# Patient Record
Sex: Female | Born: 1980 | Race: White | Hispanic: No | Marital: Single | Smoking: Former smoker
Health system: Southern US, Community
[De-identification: ages and names within clinical notes are randomized; demographics above are authoritative.]

## PROBLEM LIST (undated history)

## (undated) ENCOUNTER — Inpatient Hospital Stay (HOSPITAL_COMMUNITY): Payer: Self-pay

## (undated) DIAGNOSIS — B999 Unspecified infectious disease: Secondary | ICD-10-CM

## (undated) DIAGNOSIS — R112 Nausea with vomiting, unspecified: Secondary | ICD-10-CM

## (undated) DIAGNOSIS — T8859XA Other complications of anesthesia, initial encounter: Secondary | ICD-10-CM

## (undated) DIAGNOSIS — N879 Dysplasia of cervix uteri, unspecified: Secondary | ICD-10-CM

## (undated) DIAGNOSIS — N83209 Unspecified ovarian cyst, unspecified side: Secondary | ICD-10-CM

## (undated) DIAGNOSIS — J45909 Unspecified asthma, uncomplicated: Secondary | ICD-10-CM

## (undated) DIAGNOSIS — F419 Anxiety disorder, unspecified: Secondary | ICD-10-CM

## (undated) DIAGNOSIS — B977 Papillomavirus as the cause of diseases classified elsewhere: Secondary | ICD-10-CM

## (undated) DIAGNOSIS — Z8619 Personal history of other infectious and parasitic diseases: Secondary | ICD-10-CM

## (undated) DIAGNOSIS — N159 Renal tubulo-interstitial disease, unspecified: Secondary | ICD-10-CM

## (undated) DIAGNOSIS — N3090 Cystitis, unspecified without hematuria: Secondary | ICD-10-CM

## (undated) DIAGNOSIS — Z9889 Other specified postprocedural states: Secondary | ICD-10-CM

## (undated) DIAGNOSIS — R0602 Shortness of breath: Secondary | ICD-10-CM

## (undated) DIAGNOSIS — D649 Anemia, unspecified: Secondary | ICD-10-CM

## (undated) DIAGNOSIS — IMO0002 Reserved for concepts with insufficient information to code with codable children: Secondary | ICD-10-CM

## (undated) DIAGNOSIS — R87619 Unspecified abnormal cytological findings in specimens from cervix uteri: Secondary | ICD-10-CM

## (undated) DIAGNOSIS — T4145XA Adverse effect of unspecified anesthetic, initial encounter: Secondary | ICD-10-CM

## (undated) HISTORY — DX: Unspecified ovarian cyst, unspecified side: N83.209

## (undated) HISTORY — DX: Unspecified abnormal cytological findings in specimens from cervix uteri: R87.619

## (undated) HISTORY — DX: Anxiety disorder, unspecified: F41.9

## (undated) HISTORY — DX: Other complications of anesthesia, initial encounter: T88.59XA

## (undated) HISTORY — DX: Papillomavirus as the cause of diseases classified elsewhere: B97.7

## (undated) HISTORY — DX: Reserved for concepts with insufficient information to code with codable children: IMO0002

## (undated) HISTORY — DX: Unspecified infectious disease: B99.9

## (undated) HISTORY — DX: Personal history of other infectious and parasitic diseases: Z86.19

## (undated) HISTORY — DX: Shortness of breath: R06.02

## (undated) HISTORY — DX: Other specified postprocedural states: Z98.890

## (undated) HISTORY — PX: WISDOM TOOTH EXTRACTION: SHX21

## (undated) HISTORY — DX: Other specified postprocedural states: R11.2

## (undated) HISTORY — PX: ABDOMINAL SURGERY: SHX537

## (undated) HISTORY — DX: Unspecified asthma, uncomplicated: J45.909

## (undated) HISTORY — DX: Renal tubulo-interstitial disease, unspecified: N15.9

## (undated) HISTORY — DX: Anemia, unspecified: D64.9

## (undated) HISTORY — DX: Dysplasia of cervix uteri, unspecified: N87.9

## (undated) HISTORY — DX: Adverse effect of unspecified anesthetic, initial encounter: T41.45XA

## (undated) HISTORY — DX: Cystitis, unspecified without hematuria: N30.90

---
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
## 2002-01-25 ENCOUNTER — Other Ambulatory Visit: Admission: RE | Admit: 2002-01-25 | Discharge: 2002-01-25 | Payer: Self-pay | Admitting: Obstetrics and Gynecology

## 2002-12-19 ENCOUNTER — Other Ambulatory Visit: Admission: RE | Admit: 2002-12-19 | Discharge: 2002-12-19 | Payer: Self-pay | Admitting: Obstetrics and Gynecology

## 2003-12-22 ENCOUNTER — Other Ambulatory Visit: Admission: RE | Admit: 2003-12-22 | Discharge: 2003-12-22 | Payer: Self-pay | Admitting: Obstetrics and Gynecology

## 2005-08-03 ENCOUNTER — Other Ambulatory Visit: Admission: RE | Admit: 2005-08-03 | Discharge: 2005-08-03 | Payer: Self-pay | Admitting: Obstetrics and Gynecology

## 2007-05-17 DIAGNOSIS — IMO0002 Reserved for concepts with insufficient information to code with codable children: Secondary | ICD-10-CM

## 2007-05-17 HISTORY — DX: Reserved for concepts with insufficient information to code with codable children: IMO0002

## 2007-07-11 ENCOUNTER — Encounter (INDEPENDENT_AMBULATORY_CARE_PROVIDER_SITE_OTHER): Payer: Self-pay | Admitting: Obstetrics and Gynecology

## 2007-07-11 ENCOUNTER — Ambulatory Visit (HOSPITAL_COMMUNITY): Admission: RE | Admit: 2007-07-11 | Discharge: 2007-07-11 | Payer: Self-pay | Admitting: Obstetrics and Gynecology

## 2010-09-28 NOTE — Op Note (Signed)
NAME:  THEOLA, CUELLAR             ACCOUNT NO.:  [id number]   MEDICAL RECORD NO.:  [id number]          PATIENT TYPE:  AMB   LOCATION:  SDC                           FACILITY:  [facility]   PHYSICIAN:  Naima A. Dillard, M.D. DATE OF BIRTH:  Mar 03, 1981   DATE OF PROCEDURE:  07/11/2007  DATE OF DISCHARGE:                               OPERATIVE REPORT   TIME OF SURGERY:  9:22 a.m.   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   PROCEDURE:  LEEP (loop electrosurgical excision procedure), ECC  (endocervical curettage) and colposcopy.   SURGEON:  Naima A. Dillard, M.D.   ASSISTANT:  No assistants.   ANESTHESIA:  MAC and local.   FINDINGS:  Are acetowhite changes and mosaicism at 12 to 2 o'clock and  acetowhite changes at 6 o'clock on colposcopy.   SPECIMENS:  Are LEEP specimen of the cervix sent to pathology.   ESTIMATED BLOOD LOSS:  Minimal.   COMPLICATIONS:  None.   DISPOSITION:  The patient went to PACU in stable condition.   PROCEDURE IN DETAIL:  The patient was taken to the operating room and  placed in dorsal lithotomy position and prepped and draped in a normal  sterile fashion.  A bivalve speculum was placed into the vagina and  acetic acid was placed along the cervix.  Colposcopy was done.  The  findings noted above were seen.  The patient was then prepped and  draped.  A coated bivalve speculum was placed into the vagina.  Anterior  lip of the cervix was grasped with a single tooth tenaculum.  Twenty mL  of 2% lidocaine was used for cervical block.  Lugol's solution was then  placed and noted to have defects all along the transition zone but  mainly from 12 to 2.  The large white loop was then used to remove the  transition zone without difficulty.  It was placed on a cork board for  pathology.  Before the  LEEP was done I did do ECC (endocervical curettage) of the endocervical  canal.  Any  bleeding areas were made hemostatic with Bovie cautery and  Monsel's solution.  All instruments were removed from the vagina.  Sponge, lap and needle counts were correct.  The patient went to the  recovery room in stable condition.      Naima A. Normand Sloop, M.D.  Electronically Signed     NAD/MEDQ  D:  07/11/2007  T:  07/11/2007  Job:  [id number]

## 2010-09-28 NOTE — H&P (Signed)
NAME:  Tonya Day, Tonya Day             ACCOUNT NO.:  [id number]   MEDICAL RECORD NO.:  [id number]          PATIENT TYPE:  AMB   LOCATION:  SDC                           [facility]:  [facility]   PHYSICIAN:  Naima A. Dillard, M.D. DATE OF BIRTH:  08-Jun-1980   DATE OF ADMISSION:  DATE OF DISCHARGE:                              HISTORY & PHYSICAL   ADMISSION DIAGNOSES:  1. Cervical intraepithelial neoplasia-II.  2. Cervical intraepithelial neoplasia-III.   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  The patient declined  having a colposcopy at that time.  She then came in 2009 for another Pap  smear which showed ASCUS, cannot rule out a high-grade lesion.  She  decided to have colposcopy, a biopsy was done at 2 o'clock and was  significant for CIN-II to III. The patient was given the option of  observation versus cryo and LEEP.  All treatments were reviewed with  risks and benefits.  The patient has chosen LEEP.   PAST MEDICAL HISTORY:  Anxiety disorder.   PAST SURGICAL HISTORY:  Unremarkable.   OB HISTORY:  Significant for spontaneous abortion in the first trimester  without any complications.   SOCIAL HISTORY:  The patient denies any alcohol or illicit drug use.   FAMILY HISTORY:  Significant for diabetes and hypertension.   MEDICATIONS:  Loestrin-24.   ALLERGIES:  The patient has no known drug allergies.   PAST SURGICAL HISTORY:  In 1997 she had a diagnostic laparoscopy.   PHYSICAL EXAMINATION:  VITAL SIGNS:  Blood pressure 126/82, 138 pounds.  Pulse is 72.  HEENT:  Pupils are equal.  Hearing is normal.  Throat clear.  NECK:  Thyroid not enlarged.  HEART:  Regular rate and rhythm.  LUNGS:  Clear to auscultation bilaterally.  ABDOMEN:  Soft and nontender without any masses or organomegaly.  EXTREMITIES:  No clubbing, cyanosis or edema.Marland Kitchen  NEUROLOGIC: Within normal limits.  VAGINAL:  Within normal limits.   Cervix nontender without any lesions.  Uterus was normal in shape, size and consistency.  Adnexa nontender.   REVIEW OF SYSTEMS:  PSYCHIATRIC:  Significant for anxiety.  GENITOURINARY:  Significant for CIN-II TO III.  CARDIOVASCULAR:  No  palpitations.  RESPIRATORY:  No history of asthma.  ENDOCRINE:  No  history of thyroid disease.   ASSESSMENT:  Cervical dysplasia.   PLAN:  LEEP.  The patient understands the risks to include bleeding,  infection, damage to the cervix causing incompetent cervix or stenotic  cervix.  The patient understands this may recur and all the margins may  not be free.      Naima A. Normand Sloop, M.D.  Electronically Signed     NAD/MEDQ  D:  07/10/2007  T:  07/11/2007  Job:  [id number]

## 2010-09-28 NOTE — Op Note (Signed)
NAME:  RENELL, COAXUM             ACCOUNT NO.:  [id number]   MEDICAL RECORD NO.:  [id number]          PATIENT TYPE:  AMB   LOCATION:  SDC                           [facility]:  [facility]   PHYSICIAN:  Naima A. Dillard, M.D. DATE OF BIRTH:  [birth year]   DATE OF PROCEDURE:  07/11/2007  DATE OF DISCHARGE:                               OPERATIVE REPORT   PREOPERATIVE DIAGNOSIS:  Cervical dysplasia.   POSTOPERATIVE DIAGNOSIS:  Cervical dysplasia.   PROCEDURE:  Loop electrode excision procedure, endocervical curettage,  colposcopy.   SURGEON:  Naima A. Normand Sloop, M.D.   Dictation ends at this point.      Naima A. Normand Sloop, M.D.  Electronically Signed     NAD/MEDQ  D:  07/11/2007  T:  07/11/2007  Job:  [id number]

## 2011-02-07 LAB — CBC
Platelets: 246
RDW: 12.4
WBC: 7.1

## 2011-02-07 LAB — HCG, QUANTITATIVE, PREGNANCY: hCG, Beta Chain, Quant, S: <2

## 2011-07-12 ENCOUNTER — Ambulatory Visit: Payer: Self-pay | Admitting: Obstetrics and Gynecology

## 2011-08-03 ENCOUNTER — Ambulatory Visit: Payer: Self-pay | Admitting: Obstetrics and Gynecology

## 2011-08-30 ENCOUNTER — Ambulatory Visit: Payer: Self-pay | Admitting: Obstetrics and Gynecology

## 2011-10-19 ENCOUNTER — Ambulatory Visit: Payer: Self-pay | Admitting: Obstetrics and Gynecology

## 2011-12-16 ENCOUNTER — Encounter: Payer: Self-pay | Admitting: Obstetrics and Gynecology

## 2011-12-16 ENCOUNTER — Ambulatory Visit (INDEPENDENT_AMBULATORY_CARE_PROVIDER_SITE_OTHER): Payer: PRIVATE HEALTH INSURANCE | Admitting: Obstetrics and Gynecology

## 2011-12-16 VITALS — BP 102/64 | HR 78 | Ht 63.0 in | Wt 157.0 lb

## 2011-12-16 DIAGNOSIS — Z124 Encounter for screening for malignant neoplasm of cervix: Secondary | ICD-10-CM

## 2011-12-16 DIAGNOSIS — D069 Carcinoma in situ of cervix, unspecified: Secondary | ICD-10-CM

## 2011-12-16 MED ORDER — DROSPIRENONE-ETHINYL ESTRADIOL 3-0.02 MG PO TABS: 1.0000 | ORAL_TABLET | Freq: Every day | ORAL | Status: DC

## 2011-12-16 NOTE — Progress Notes (Signed)
Last Pap: 05/27/10 WNL: Yes Regular Periods:no, takes bc pills continuously but pt makes sure she has 3 per year Contraception: pill-gianvi  Monthly Breast exam:no Tetanus<56yrs:yes Nl.Bladder Function:yes Daily BMs:yes Healthy Diet:yes Calcium:yes Mammogram:no Date of Mammogram: n/a Exercise:yes Have often Exercise: 3-4 times per week Seatbelt: yes Abuse at home: no Stressful work:yes Sigmoid-colonoscopy: n/a Bone Density: No PCP: Dr Normand Sloop  Change in PMH: none Change in GNF:AOZH Pt without complaints Physical Examination: General appearance - alert, well appearing, and in no distress Mental status - normal mood, behavior, speech, dress, motor activity, and thought processes Neck - supple, no significant adenopathy, thyroid exam: thyroid is normal in size without nodules or tenderness Chest - clear to auscultation, no wheezes, rales or rhonchi, symmetric air entry Heart - normal rate and regular rhythm Abdomen - soft, nontender, nondistended, no masses or organomegaly Breasts - breasts appear normal, no suspicious masses, no skin or nipple changes or axillary nodes Pelvic - normal external genitalia, vulva, vagina, cervix, uterus and adnexa Rectal - normal rectal, no masses, rectal exam not indicated Back exam - full range of motion, no tenderness, palpable spasm or pain on motion Neurological - alert, oriented, normal speech, no focal findings or movement disorder noted Musculoskeletal - no joint tenderness, deformity or swelling Extremities - no edema, redness or tenderness in the calves or thighs Skin - normal coloration and turgor, no rashes, no suspicious skin lesions noted Routine exam Pap sent yes h/o CIN3 Mammogram due no pt considering pregnancy.  she will stay on YAZ for now.  RX sent.   RT 1 yr or prn preconceptional counseling done and PNV samples given to pt should she think about starting this year

## 2011-12-19 LAB — PAP IG W/ RFLX HPV ASCU

## 2012-05-16 NOTE — L&D Delivery Note (Signed)
Delivery Note  First Stage: Labor onset: [phone] with AROM Augmentation - none Analgesia /Anesthesia intrapartum: nubain 10 sub Q intrapartum AROM at 0857  Second Stage: Complete dilation at 1504 Onset of pushing at 1515 FHR second stage intermittently 115-125  Delivery of a viable female at 36 by CNM in direct OA position no nuchal cord Cord double clamped after cessation of pulsation, cut by sister  Patient assisted out of tub to bed Cord blood sample collected    Third Stage: Placenta retained for 83 minutes      1700 baby to breast      1715 pitocin IM /exam - placenta remain intact  / uterine massage without response / no movement with traction to cord      1730 - TC to Dr Wonda Olds - notified retained placenta at an hour - no separation - no bleeding      1735 - pitocin 10 units umbilical vein / up to bathroom to attempt to void - pass placenta       1750 - back to bed  / IV start  / Dr Wonda Olds in with patient      1757 - placenta manually removed by Dr Wonda Olds      1758 - cytotec 800 mcg PR      1800 - nubain 10 IV per patient request for pain       Placenta intact - 3 vc Placenta disposition: hospital disposal Uterine tone firm / bleeding small  1st degree laceration with vaginal extension Anesthesia for repair: 1% xylocaine local Repair  3-0 vicryl for vaginal repair  4-0 vicryl for 1st degree subcuticular repair Marked vaginal edema and bruising Est. Blood Loss (mL): 350  Complications: retained placenta with manual removal - no hemorrhage  Mom to postpartum.  Baby to Mom for bonding.    Marlinda Mike CNM, MSN, [facility] 01/29/2013, 6:44 PM

## 2012-05-28 ENCOUNTER — Other Ambulatory Visit (INDEPENDENT_AMBULATORY_CARE_PROVIDER_SITE_OTHER): Payer: PRIVATE HEALTH INSURANCE

## 2012-05-28 DIAGNOSIS — N912 Amenorrhea, unspecified: Secondary | ICD-10-CM

## 2012-05-28 NOTE — Progress Notes (Unsigned)
Pt here for UPT-positive. LMP 04-22-2012,  EDC-01-20-2013, PNV samples given.  Pt to sch  NOB work-up and interview.  This is pts first pregnancy.

## 2012-06-29 ENCOUNTER — Ambulatory Visit: Payer: PRIVATE HEALTH INSURANCE | Admitting: Obstetrics and Gynecology

## 2012-06-29 DIAGNOSIS — Z331 Pregnant state, incidental: Secondary | ICD-10-CM

## 2012-06-29 LAB — POCT URINALYSIS DIPSTICK
Blood, UA: NEGATIVE
Glucose, UA: NEGATIVE
Protein, UA: NEGATIVE
Spec Grav, UA: <=1.005
Urobilinogen, UA: NEGATIVE

## 2012-06-29 NOTE — Progress Notes (Signed)
NOB INTERVIEW. Pt prefers to wait until NOB W/U for PN labs due to oinsurance coverage. Pt needs toxo screen with prenatal labs. Pt declines 1st trimester screen. NOB W/u sched with VL 08/02/12

## 2012-06-30 ENCOUNTER — Encounter: Payer: Self-pay | Admitting: Obstetrics and Gynecology

## 2012-06-30 DIAGNOSIS — Z91040 Latex allergy status: Secondary | ICD-10-CM | POA: Insufficient documentation

## 2012-06-30 DIAGNOSIS — Z86718 Personal history of other venous thrombosis and embolism: Secondary | ICD-10-CM | POA: Insufficient documentation

## 2012-06-30 DIAGNOSIS — F411 Generalized anxiety disorder: Secondary | ICD-10-CM | POA: Insufficient documentation

## 2012-06-30 DIAGNOSIS — N12 Tubulo-interstitial nephritis, not specified as acute or chronic: Secondary | ICD-10-CM | POA: Insufficient documentation

## 2012-07-01 LAB — CULTURE, OB URINE

## 2012-07-03 ENCOUNTER — Telehealth: Payer: Self-pay | Admitting: Obstetrics and Gynecology

## 2012-07-06 ENCOUNTER — Ambulatory Visit: Payer: PRIVATE HEALTH INSURANCE | Admitting: Certified Nurse Midwife

## 2012-07-06 ENCOUNTER — Encounter: Payer: Self-pay | Admitting: Certified Nurse Midwife

## 2012-07-06 VITALS — BP 108/60 | Wt 166.0 lb

## 2012-07-06 LAB — POCT WET PREP (WET MOUNT)
Bacteria Wet Prep HPF POC: NEGATIVE
Source Wet Prep POC: NEGATIVE

## 2012-07-06 LAB — OB RESULTS CONSOLE GC/CHLAMYDIA
Chlamydia: NEGATIVE
Gonorrhea: NEGATIVE

## 2012-07-06 NOTE — Progress Notes (Signed)
[redacted]w[redacted]d NOB work-up Pap done 12/2011 - WNL GC/CT & wet prep today

## 2012-07-06 NOTE — Progress Notes (Signed)
Pt. Here for New OB workup.  Informed at onset of appt. That she was changing practices to [facility]" due to desire to have [place] for viability. Pt. Consents to visit today but declines labs. Discussed rationale for PN labs and Toxo. Also states she is awaiting private insurance.  Pt. Denies problems with pg. LMP 04-22-13 and nl.  Cyles are"like clock work".  Denies first trimester spotting.  Oriented to [facility] and CNM service (Does desire CNM care with minimal intervention).  O- Heart RRR without murmur  Lungs C to A  Thyroid wnl  Breast exam- nl  Back- No CVAT  Abd.- Non tender   FHT's audible (148-152) regular  Pelvic exam- Clear white d/c   Cx. Pink without lesions   Uterus 10-12 week size and non-tender   Adnexa nl. Ass: Pregnancy at 10 weeks and 5 days  Plan: Discussed toxoplasmosis and pg. Recommend test. (Works with [place] [employer] 10 years)  Encourage client not to change liter and encouraged to wear gloves  Discussed nl changes in pg. For first trimester as well as diet  Discussed that we recommend an anatomy scan at 18-20 weeks  Declines PNV  Offer pt. An appt. For follow up but has appt. At [facility]             Pt. States she has an appt. In 3 weeks with "[facility]"  Labs today- Wet prep- Nl, U/S-Nl, Pap, G/C and chlamydia with consent. Hulen Luster, CNM, FNP

## 2012-07-08 LAB — GC/CHLAMYDIA PROBE AMP
CT Probe RNA: NEGATIVE
GC Probe RNA: NEGATIVE

## 2012-07-13 ENCOUNTER — Encounter: Payer: PRIVATE HEALTH INSURANCE | Admitting: Certified Nurse Midwife

## 2012-07-13 ENCOUNTER — Telehealth: Payer: Self-pay | Admitting: Obstetrics and Gynecology

## 2012-07-13 ENCOUNTER — Encounter (HOSPITAL_COMMUNITY): Payer: Self-pay | Admitting: Obstetrics and Gynecology

## 2012-07-13 ENCOUNTER — Other Ambulatory Visit: Payer: Self-pay | Admitting: Obstetrics and Gynecology

## 2012-07-13 ENCOUNTER — Inpatient Hospital Stay (HOSPITAL_COMMUNITY)
Admission: AD | Admit: 2012-07-13 | Discharge: 2012-07-13 | Disposition: A | Discharge status: Home / Self Care | Payer: PRIVATE HEALTH INSURANCE | Source: Ambulatory Visit | Attending: Obstetrics and Gynecology | Admitting: Obstetrics and Gynecology

## 2012-07-13 ENCOUNTER — Inpatient Hospital Stay (HOSPITAL_COMMUNITY): Payer: PRIVATE HEALTH INSURANCE

## 2012-07-13 DIAGNOSIS — Z2989 Encounter for other specified prophylactic measures: Secondary | ICD-10-CM | POA: Insufficient documentation

## 2012-07-13 DIAGNOSIS — O209 Hemorrhage in early pregnancy, unspecified: Secondary | ICD-10-CM | POA: Insufficient documentation

## 2012-07-13 DIAGNOSIS — N12 Tubulo-interstitial nephritis, not specified as acute or chronic: Secondary | ICD-10-CM

## 2012-07-13 LAB — CBC
HCT: 36.2 % (ref 36.0–46.0)
MCH: 29.5 pg (ref 26.0–34.0)
MCHC: 34.3 g/dL (ref 30.0–36.0)
MCV: 86 fL (ref 78.0–100.0)
RDW: 12.4 % (ref 11.5–15.5)
WBC: 10.4 10*3/uL (ref 4.0–10.5)

## 2012-07-13 MED ORDER — RHO D IMMUNE GLOBULIN 1500 UNIT/2ML IJ SOLN
300.0000 ug | Freq: Once | INTRAMUSCULAR | Status: AC
  Administered 2012-07-13: 300 ug via INTRAMUSCULAR
  Filled 2012-07-13: qty 2

## 2012-07-13 NOTE — MAU Note (Signed)
Been very light spotting since yesterday.  Is pregnant,  States is A neg, hx of received rhophylac injection when had TAB.   Quite a bit of pain on left side, not unbearable- just only on left.

## 2012-07-13 NOTE — MAU Note (Signed)
Tonya Day CNM wants to see pt, not just for injection

## 2012-07-13 NOTE — Telephone Encounter (Signed)
Tc from pt. Pt with vag spotting since yesterday. Pt c/o cramping only on left side. Pt is A -. Consulted with SL, pt to go to MAU for eval. Pt agrees.

## 2012-07-13 NOTE — MAU Note (Signed)
Noted some light bleeding yesterday, today a little more. Cramping on L side "regularly".
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
## 2012-07-14 LAB — RH IG WORKUP (INCLUDES ABO/RH)
ABO/RH(D): A NEG
Antibody Screen: NEGATIVE
Gestational Age(Wks): 11

## 2012-07-17 ENCOUNTER — Ambulatory Visit: Payer: PRIVATE HEALTH INSURANCE | Admitting: Obstetrics and Gynecology

## 2012-07-17 ENCOUNTER — Telehealth: Payer: Self-pay | Admitting: Obstetrics and Gynecology

## 2012-07-17 VITALS — BP 100/64 | Wt 165.0 lb

## 2012-07-17 NOTE — Progress Notes (Signed)
[redacted]w[redacted]d Work in visit C/o continued spotting, had [place] in MAU on Friday that was normal Pt states bleeding has not been red, changes a panty liner a couple times/day Has some lower pelvic cramping, RLP Discussed stretching and exercises Pt declines to have f/u visits, is tx  Instructed pt on pelvic rest RTO 1 wk if bleeding doesn't stop, will repeat US Or if pain or heavy bleeding

## 2012-07-17 NOTE — Telephone Encounter (Signed)
Tc to pt c/o continuous spotting and cramping. Pt states spotting is light brown in color and was seen at MAU 4 days ago with the same problem. Pt was given rhogam, offered appt this afternoon with SL for evaluation. Pt agreeable.

## 2012-07-17 NOTE — Progress Notes (Signed)
[redacted]w[redacted]d C/o brown vaginal discharge, aching, pulling, and cramping in abdominal area  Seen in MAU on Friday for same sx's Pt declines pelvic exam today. Pt is transferring to [facility]

## 2012-11-10 ENCOUNTER — Encounter (HOSPITAL_COMMUNITY): Payer: Self-pay | Admitting: *Deleted

## 2012-11-10 ENCOUNTER — Inpatient Hospital Stay (HOSPITAL_COMMUNITY)
Admission: AD | Admit: 2012-11-10 | Discharge: 2012-11-10 | Disposition: A | Discharge status: Home / Self Care | Payer: 59 | Source: Ambulatory Visit | Attending: Obstetrics & Gynecology | Admitting: Obstetrics & Gynecology

## 2012-11-10 DIAGNOSIS — N12 Tubulo-interstitial nephritis, not specified as acute or chronic: Secondary | ICD-10-CM

## 2012-11-10 DIAGNOSIS — A088 Other specified intestinal infections: Secondary | ICD-10-CM | POA: Insufficient documentation

## 2012-11-10 DIAGNOSIS — O99891 Other specified diseases and conditions complicating pregnancy: Secondary | ICD-10-CM | POA: Insufficient documentation

## 2012-11-10 LAB — URINALYSIS, ROUTINE W REFLEX MICROSCOPIC
Bilirubin Urine: NEGATIVE
Glucose, UA: NEGATIVE mg/dL
Glucose, UA: NEGATIVE mg/dL
Hgb urine dipstick: NEGATIVE
Ketones, ur: >80 mg/dL — AB
Leukocytes, UA: NEGATIVE
Protein, ur: NEGATIVE mg/dL
pH: 6 (ref 5.0–8.0)

## 2012-11-10 MED ORDER — PROMETHAZINE HCL 25 MG/ML IJ SOLN
25.0000 mg | Freq: Once | INTRAMUSCULAR | Status: AC
  Administered 2012-11-10: 25 mg via INTRAVENOUS
  Filled 2012-11-10: qty 1

## 2012-11-10 MED ORDER — LACTATED RINGERS IV BOLUS (SEPSIS)
1000.0000 mL | Freq: Once | INTRAVENOUS | Status: AC
  Administered 2012-11-10: 1000 mL via INTRAVENOUS

## 2012-11-10 MED ORDER — ONDANSETRON HCL 4 MG PO TABS: 4.0000 mg | ORAL_TABLET | Freq: Four times a day (QID) | ORAL | Status: DC

## 2012-11-10 NOTE — MAU Note (Signed)
Pt reports having N/V/D with chills since yesterday. Can not even keep water down.
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
## 2012-11-13 LAB — OB RESULTS CONSOLE RUBELLA ANTIBODY, IGM: Rubella: UNDETERMINED

## 2012-11-13 LAB — OB RESULTS CONSOLE HIV ANTIBODY (ROUTINE TESTING): HIV: NONREACTIVE

## 2013-01-01 LAB — OB RESULTS CONSOLE GBS: GBS: NEGATIVE

## 2013-01-27 ENCOUNTER — Inpatient Hospital Stay (HOSPITAL_COMMUNITY): Admission: AD | Admit: 2013-01-27 | Payer: Self-pay | Source: Ambulatory Visit | Admitting: Obstetrics & Gynecology

## 2013-01-28 ENCOUNTER — Telehealth (HOSPITAL_COMMUNITY): Payer: Self-pay | Admitting: *Deleted

## 2013-01-28 ENCOUNTER — Encounter (HOSPITAL_COMMUNITY): Payer: Self-pay | Admitting: *Deleted

## 2013-01-28 NOTE — Telephone Encounter (Signed)
Preadmission screen  

## 2013-01-29 ENCOUNTER — Encounter (HOSPITAL_COMMUNITY): Payer: Self-pay | Admitting: Anesthesiology

## 2013-01-29 ENCOUNTER — Encounter (HOSPITAL_COMMUNITY): Payer: Self-pay

## 2013-01-29 ENCOUNTER — Inpatient Hospital Stay (HOSPITAL_COMMUNITY)
Admission: RE | Admit: 2013-01-29 | Discharge: 2013-01-31 | DRG: 774 | Disposition: A | Discharge status: Home / Self Care | Payer: 59 | Source: Ambulatory Visit | Attending: Obstetrics & Gynecology | Admitting: Obstetrics & Gynecology

## 2013-01-29 VITALS — BP 125/80 | HR 78 | Temp 97.9°F | Resp 16 | Ht 63.0 in | Wt 207.0 lb

## 2013-01-29 DIAGNOSIS — O9903 Anemia complicating the puerperium: Secondary | ICD-10-CM | POA: Diagnosis not present

## 2013-01-29 DIAGNOSIS — D62 Acute posthemorrhagic anemia: Secondary | ICD-10-CM | POA: Diagnosis not present

## 2013-01-29 DIAGNOSIS — O139 Gestational [pregnancy-induced] hypertension without significant proteinuria, unspecified trimester: Principal | ICD-10-CM | POA: Diagnosis present

## 2013-01-29 DIAGNOSIS — O480 Post-term pregnancy: Secondary | ICD-10-CM | POA: Diagnosis present

## 2013-01-29 DIAGNOSIS — N12 Tubulo-interstitial nephritis, not specified as acute or chronic: Secondary | ICD-10-CM

## 2013-01-29 LAB — URIC ACID: Uric Acid, Serum: 6.5 mg/dL (ref 2.4–7.0)

## 2013-01-29 LAB — RPR: RPR Ser Ql: NONREACTIVE

## 2013-01-29 LAB — COMPREHENSIVE METABOLIC PANEL
ALT: 14 U/L (ref 0–35)
AST: 23 U/L (ref 0–37)
Albumin: 2.4 g/dL — ABNORMAL LOW (ref 3.5–5.2)
Alkaline Phosphatase: 174 U/L — ABNORMAL HIGH (ref 39–117)
BUN: 11 mg/dL (ref 6–23)
CO2: 21 mEq/L (ref 19–32)
Calcium: 8.7 mg/dL (ref 8.4–10.5)
Chloride: 103 mEq/L (ref 96–112)
Creatinine, Ser: 0.84 mg/dL (ref 0.50–1.10)
GFR calc Af Amer: >90 mL/min (ref >90)
GFR calc non Af Amer: >90 mL/min (ref >90)
Glucose, Bld: 113 mg/dL — ABNORMAL HIGH (ref 70–99)
Potassium: 3.5 mEq/L (ref 3.5–5.1)
Sodium: 135 mEq/L (ref 135–145)
Total Bilirubin: 0.2 mg/dL — ABNORMAL LOW (ref 0.3–1.2)
Total Protein: 5.6 g/dL — ABNORMAL LOW (ref 6.0–8.3)

## 2013-01-29 LAB — TYPE AND SCREEN
ABO/RH(D): A NEG
Antibody Screen: POSITIVE
DAT, IgG: NEGATIVE

## 2013-01-29 LAB — CBC
HCT: 32 % — ABNORMAL LOW (ref 36.0–46.0)
Hemoglobin: 10.6 g/dL — ABNORMAL LOW (ref 12.0–15.0)
MCH: 29 pg (ref 26.0–34.0)
MCHC: 33.1 g/dL (ref 30.0–36.0)
MCV: 87.7 fL (ref 78.0–100.0)
Platelets: 158 10*3/uL (ref 150–400)
RBC: 3.65 MIL/uL — ABNORMAL LOW (ref 3.87–5.11)
RDW: 15.8 % — ABNORMAL HIGH (ref 11.5–15.5)
WBC: 9.1 10*3/uL (ref 4.0–10.5)

## 2013-01-29 MED ORDER — FLUTICASONE PROPIONATE HFA 44 MCG/ACT IN AERO
2.0000 | INHALATION_SPRAY | Freq: Two times a day (BID) | RESPIRATORY_TRACT | Status: DC
  Filled 2013-01-29: qty 10.6

## 2013-01-29 MED ORDER — MISOPROSTOL 200 MCG PO TABS
ORAL_TABLET | ORAL | Status: AC
  Filled 2013-01-29: qty 1

## 2013-01-29 MED ORDER — OXYTOCIN 40 UNITS IN LACTATED RINGERS INFUSION - SIMPLE MED
INTRAVENOUS | Status: AC
  Administered 2013-01-29: 40 [IU] via INTRAVENOUS
  Filled 2013-01-29: qty 1000

## 2013-01-29 MED ORDER — SODIUM CHLORIDE 0.9 % IV SOLN: 250.0000 mL | INTRAVENOUS | Status: DC | PRN

## 2013-01-29 MED ORDER — DIBUCAINE 1 % RE OINT: 1.0000 "application " | TOPICAL_OINTMENT | RECTAL | Status: DC | PRN

## 2013-01-29 MED ORDER — OXYTOCIN 10 UNIT/ML IJ SOLN
10.0000 [IU] | Freq: Once | INTRAMUSCULAR | Status: AC
  Administered 2013-01-29 (×2): 10 [IU] via INTRAMUSCULAR
  Filled 2013-01-29 (×2): qty 1

## 2013-01-29 MED ORDER — IBUPROFEN 600 MG PO TABS
600.0000 mg | ORAL_TABLET | Freq: Four times a day (QID) | ORAL | Status: DC
  Administered 2013-01-29 – 2013-01-31 (×6): 600 mg via ORAL
  Filled 2013-01-29 (×6): qty 1

## 2013-01-29 MED ORDER — NITROGLYCERIN 0.4 MG/SPRAY TL SOLN
1.0000 | Status: DC | PRN
  Filled 2013-01-29: qty 4.9

## 2013-01-29 MED ORDER — BENZOCAINE-MENTHOL 20-0.5 % EX AERO
1.0000 "application " | INHALATION_SPRAY | CUTANEOUS | Status: DC | PRN
  Administered 2013-01-29: 1 via TOPICAL
  Filled 2013-01-29: qty 56

## 2013-01-29 MED ORDER — OXYTOCIN 10 UNIT/ML IJ SOLN: 10.0000 [IU] | Freq: Once | INTRAMUSCULAR | Status: DC

## 2013-01-29 MED ORDER — HYDROCHLOROTHIAZIDE 12.5 MG PO CAPS
12.5000 mg | ORAL_CAPSULE | Freq: Every day | ORAL | Status: DC
  Administered 2013-01-30: 12.5 mg via ORAL
  Filled 2013-01-29 (×2): qty 1

## 2013-01-29 MED ORDER — SIMETHICONE 80 MG PO CHEW: 80.0000 mg | CHEWABLE_TABLET | ORAL | Status: DC | PRN

## 2013-01-29 MED ORDER — SODIUM CHLORIDE 0.9 % IJ SOLN: 3.0000 mL | Freq: Two times a day (BID) | INTRAMUSCULAR | Status: DC

## 2013-01-29 MED ORDER — MISOPROSTOL 200 MCG PO TABS
800.0000 ug | ORAL_TABLET | Freq: Once | ORAL | Status: AC
  Administered 2013-01-29: 800 ug via VAGINAL

## 2013-01-29 MED ORDER — MISOPROSTOL 200 MCG PO TABS
ORAL_TABLET | ORAL | Status: AC
  Administered 2013-01-29: 800 ug via VAGINAL
  Filled 2013-01-29: qty 3

## 2013-01-29 MED ORDER — CITRIC ACID-SODIUM CITRATE 334-500 MG/5ML PO SOLN
30.0000 mL | ORAL | Status: DC | PRN
  Filled 2013-01-29: qty 15

## 2013-01-29 MED ORDER — NALBUPHINE SYRINGE 5 MG/0.5 ML
10.0000 mg | INJECTION | INTRAMUSCULAR | Status: DC | PRN
  Administered 2013-01-29: 10 mg via SUBCUTANEOUS
  Filled 2013-01-29 (×3): qty 1

## 2013-01-29 MED ORDER — LACTATED RINGERS IV BOLUS (SEPSIS)
500.0000 mL | Freq: Once | INTRAVENOUS | Status: AC
  Administered 2013-01-29: 500 mL via INTRAVENOUS

## 2013-01-29 MED ORDER — OXYTOCIN 40 UNITS IN LACTATED RINGERS INFUSION - SIMPLE MED
500.0000 mL/h | INTRAVENOUS | Status: DC
  Administered 2013-01-29: 40 [IU] via INTRAVENOUS

## 2013-01-29 MED ORDER — SODIUM CHLORIDE 0.9 % IJ SOLN: 3.0000 mL | INTRAMUSCULAR | Status: DC | PRN

## 2013-01-29 MED ORDER — ACETAMINOPHEN 325 MG PO TABS: 650.0000 mg | ORAL_TABLET | ORAL | Status: DC | PRN

## 2013-01-29 MED ORDER — NALBUPHINE SYRINGE 5 MG/0.5 ML
10.0000 mg | INJECTION | Freq: Once | INTRAMUSCULAR | Status: AC
  Administered 2013-01-29: 10 mg via INTRAVENOUS

## 2013-01-29 MED ORDER — IBUPROFEN 600 MG PO TABS
600.0000 mg | ORAL_TABLET | Freq: Four times a day (QID) | ORAL | Status: DC | PRN
  Administered 2013-01-29: 600 mg via ORAL
  Filled 2013-01-29: qty 1

## 2013-01-29 MED ORDER — LIDOCAINE HCL (PF) 1 % IJ SOLN
30.0000 mL | INTRAMUSCULAR | Status: DC | PRN
Start: 2013-01-29 — End: 2013-01-31
  Administered 2013-01-29: 30 mL via SUBCUTANEOUS
  Filled 2013-01-29 (×2): qty 30

## 2013-01-29 MED ORDER — LANOLIN HYDROUS EX OINT: TOPICAL_OINTMENT | CUTANEOUS | Status: DC | PRN

## 2013-01-29 MED ORDER — WITCH HAZEL-GLYCERIN EX PADS: 1.0000 "application " | MEDICATED_PAD | CUTANEOUS | Status: DC | PRN

## 2013-01-29 MED ORDER — OXYCODONE-ACETAMINOPHEN 5-325 MG PO TABS
1.0000 | ORAL_TABLET | ORAL | Status: DC | PRN
  Administered 2013-01-30 (×2): 1 via ORAL
  Filled 2013-01-29 (×2): qty 1

## 2013-01-29 NOTE — Progress Notes (Signed)
S:  Ctx painful - requesting medication and water immersion  O:  VS: Blood pressure 140/84, pulse 87, temperature 98.2 F (36.8 C), temperature source Oral, resp. rate 20, height 5\' 3"  (1.6 m), weight 93.895 kg (207 lb), last menstrual period 04/22/2012, SpO2 95.00%.        FHR : 145 rate - no audible decels        Toco: contractions every 2-3 minutes / strong         Cervix : 6cm / 90% / vtx / -1        Membranes: clear fluid - small show  A: active labor     FHR category 1  P: nubain sub-q now     water immersion - spouse and SNM and CNM providing labor support and supervision     Marlinda Mike CNM, MSN, [facility] 01/29/2013, 1:14 PM

## 2013-01-29 NOTE — Progress Notes (Signed)
S:  breathing and grunting with most ctx - constant pressure       laboring in tub after nubain - good control with ctx / resting between ctx        O:  VS: Blood pressure 141/64, pulse 74, temperature 98.2 F (36.8 C), temperature source Oral, resp. rate 18, height 5\' 3"  (1.6 m), weight 93.895 kg (207 lb), last menstrual period 04/22/2012, SpO2 95.00%, unknown if currently breastfeeding.        FHR : 125-130        Toco: contractions every 6-8  minutes         Cervix : 10 cm / 100% / vtx + 2 with caput        Membranes: ? thin yellow meconium versus thick mucus discharge  A: active labor - second stage      P: anticipate SVB - waterbirth    Marlinda Mike CNM, MSN, [facility] 01/29/2013, 6:19 PM

## 2013-01-29 NOTE — Progress Notes (Signed)
S:  Not aware of ctx  O:  VS: Blood pressure 132/93, pulse 106, temperature 97.9 F (36.6 C), temperature source Oral, resp. rate 20, height 5\' 3"  (1.6 m), weight 93.895 kg (207 lb), last menstrual period 04/22/2012.        FHR : baseline 145 / variability moderate / accelerations + / no decelerations        Toco: contractions every 5-8  minutes / mild         Cervix : 4 / 80 % / vtx / -1        Membranes: AROM - clear  A: induction of labor - AROM     FHR category 1  P: remove EFM     activity ad lib     planning water immersion in labor - water birth desired   Tonya Day CNM, MSN, [facility] 01/29/2013, 9:16 AM

## 2013-01-29 NOTE — Progress Notes (Signed)
Called Marlinda Mike CNM regarding patient and BP parameters. Last BP 141/70 with routine post-partum parameters to call for BP >140 systolic or >90 systolic. New parameters received. Will start HCTZ in AM as ordered previously. Informed her of trickle/gush with fundal massage but lochia small after voiding. Will continue to monitor patient.

## 2013-01-29 NOTE — Progress Notes (Signed)
Fredric Mare, CNM, at bedside. Orders to give 500 ml pitocin bolus followed by 500 ml bolus of LR . After, RN to NSL pt prior to transfer to [facility]

## 2013-01-29 NOTE — H&P (Signed)
OB ADMISSION/ HISTORY & PHYSICAL:  Admission Date: 01/29/2013  7:35 AM  Admit Diagnosis: 40.[redacted] weeks pregnant / dependent edema / mild hypertension  Tonya Day is a 32 y.o. female presenting for labor induction with AROM.  Prenatal History: G3P0020   EDC : 01/27/2013, by Last Menstrual Period  Prenatal care at [facility]  Primary Ob Provider: Marlinda Mike CNM Prenatal course complicated by excessive maternal weight gain - dependent edema / mild hypertension / post dates / anxiety  Prenatal Labs: ABO, Rh: --/--/A NEG (02/28 1235) Antibody: Negative (07/01 0000) Rubella: Equivocal (07/01 0000)  RPR: Nonreactive (07/01 0000)  HBsAg: Negative (07/01 0000)  HIV: Non-reactive (07/01 0000)  GTT: NL GBS: Negative (08/19 0000)   Medical / Surgical History :  Past medical history:  Past Medical History  Diagnosis Date  . Ovarian cyst   . Bladder infection   . History of chicken pox   . Complication of anesthesia   . Cervical dysplasia   . Abnormal pap   . HPV (human papilloma virus) infection   . PONV (postoperative nausea and vomiting)   . Abnormal Pap smear 2009    leep; LAST PAP 2013  . Infection     UTI X 2  . Shortness of breath   . Anemia     TAKES FE  . Kidney infection 2000  . Asthma     NO PCP  . 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 Wisdom tooth extraction    . Abdominal surgery      Family History:  Family History  Problem Relation Age of Onset  . Depression Mother   . Hypertension Mother   . Hearing loss Mother   . Asthma Father   . Drug abuse Father   . Alcohol abuse Father   . Depression Sister   . Cancer Maternal Uncle     KIDNEY  . Diabetes Maternal Uncle   . Alcohol abuse Maternal Uncle   . Heart disease Maternal Uncle   . Hyperlipidemia Maternal Uncle   . Kidney disease Maternal Uncle   . Cancer Paternal Aunt    UNKNOWN; FEMALE?  Marland Kitchen Arthritis Maternal Grandmother   . Hyperlipidemia Maternal Grandmother   . Stroke Maternal Grandmother   . Heart disease Maternal Grandfather      Social History:  reports that she quit smoking about 2 years ago. Her smoking use included Cigarettes. She smoked 0.00 packs per day. She has never used smokeless tobacco. She reports that she does not drink alcohol or use illicit drugs.  Allergies: Latex; Albuterol; Other; and Red dye   Current Medications at time of admission:  Prior to Admission medications   Medication Sig Start Date End Date Taking? Authorizing Provider  budesonide (PULMICORT) 180 MCG/ACT inhaler Inhale 1 puff into the lungs 2 (two) times daily.    Historical Provider, MD  Magnesium 500 MG CAPS Take 1,000 mg by mouth at bedtime.    Historical Provider, MD  ondansetron (ZOFRAN) 4 MG tablet Take 1 tablet (4 mg total) by mouth every 6 (six) hours. 11/10/12   Genia Del, MD  OVER THE COUNTER MEDICATION Take 6 tablets by mouth daily. Patient takes Arbonne Vitamin    Historical Provider, MD    Review of Systems: Active FM Mild ctx No PIH signs except pedal edema  Physical Exam:  VS:  Blood pressure 132/93, pulse 106, temperature 97.9 F (36.6 C), temperature source Oral, resp. rate 20, height 5\' 3"  (1.6 m), weight 93.895 kg (207 lb), last menstrual period 04/22/2012.  General: alert and oriented, appears calm and comfortable Heart: RRR Lungs: Clear lung fields Abdomen: Gravid, soft and non-tender, non-distended / uterus: gravid and NT - EFW 8 pounds Extremities: 3+ pedal and ankle edema  Genitalia / VE: Dilation: 4 Effacement (%): 80 Station: -1 Exam by:: Fredric Mare, CNM  FHR: baseline rate 140 / variability moderate / accelerations + / no decelerations TOCO: mild ctx every 6-8 minutes  Assessment: 40.[redacted] weeks gestation Induction of labor planned with AROM Dependent edema Mild hypertension / pre-existing / no evidence of PEC FHR category  1   Plan:  Admit AROM Expectant management - augment if no active labor by 1pm Water immersion with desire for water birth planned check PIH labs  Dr Wonda Olds updated with admission / plan of care   Marlinda Mike CNM, MSN, [facility] 01/29/2013, 9:10 AM

## 2013-01-30 LAB — CBC
HCT: 28.4 % — ABNORMAL LOW (ref 36.0–46.0)
Hemoglobin: 9.5 g/dL — ABNORMAL LOW (ref 12.0–15.0)
MCH: 29.5 pg (ref 26.0–34.0)
MCHC: 33.5 g/dL (ref 30.0–36.0)
MCV: 88.2 fL (ref 78.0–100.0)
Platelets: 140 10*3/uL — ABNORMAL LOW (ref 150–400)
RBC: 3.22 MIL/uL — ABNORMAL LOW (ref 3.87–5.11)
RDW: 16 % — ABNORMAL HIGH (ref 11.5–15.5)
WBC: 13.3 10*3/uL — ABNORMAL HIGH (ref 4.0–10.5)

## 2013-01-30 MED ORDER — POLYSACCHARIDE IRON COMPLEX 150 MG PO CAPS
150.0000 mg | ORAL_CAPSULE | Freq: Two times a day (BID) | ORAL | Status: DC
  Administered 2013-01-30: 150 mg via ORAL
  Filled 2013-01-30 (×3): qty 1

## 2013-01-30 MED ORDER — TETANUS-DIPHTH-ACELL PERTUSSIS 5-2.5-18.5 LF-MCG/0.5 IM SUSP
0.5000 mL | Freq: Once | INTRAMUSCULAR | Status: AC
  Administered 2013-01-30: 0.5 mL via INTRAMUSCULAR
  Filled 2013-01-30: qty 0.5

## 2013-01-30 NOTE — Progress Notes (Addendum)
PPD #1- SVD  Subjective:   Reports feeling good Tolerating po/ No nausea or vomiting Bleeding is moderate Pain controlled with Motrin, Percocet prn Up ad lib / ambulatory / voiding without problems Newborn: breastfeeding  / Circumcision: planning   Objective:   VS:  VS:  Filed Vitals:   01/29/13 2114 01/29/13 2345 01/30/13 0612 01/30/13 0756  BP: 141/70 138/80 137/83 134/71  Pulse: 78 90 86 89  Temp: 98 F (36.7 C) 98 F (36.7 C) 98.3 F (36.8 C) 98.6 F (37 C)  TempSrc: Oral Oral  Oral  Resp: 20 18 17 16   Height:      Weight:      SpO2:    95%    LABS:  Recent Labs  01/29/13 0905 01/30/13 0605  WBC 9.1 13.3*  HGB 10.6* 9.5*  PLT 158 140*   Blood type: --/--/A NEG (09/16 0905) Rubella: Equivocal (07/01 0000)   I&O: Intake/Output     09/16 0701 - 09/17 0700 09/17 0701 - 09/18 0700   P.O. 1590    I.V. (mL/kg) 500 (5.3)    IV Piggyback 500    Total Intake(mL/kg) 2590 (27.6)    Blood 350    Total Output 350     Net +2240          Urine Occurrence 300 x      Physical Exam: Alert and oriented x3 Abdomen: soft, non-tender, non-distended  Fundus: firm, non-tender, U-2 Perineum: Well approximated, no significant erythema, edema, or drainage; healing well. Lochia: moderate Extremities: 3+ edema to BLE, no calf pain or tenderness    Assessment:  PPD # 1G3P1021/ S/P:spontaneous vaginal  Gestational hypertension, delivered-resolving Anemia Doing well    Plan: Continue routine post partum orders Start Niferex 150 mg po bid Anticipate D/C home in AM   Tonya Day, N MSN, CNM 01/30/2013, 10:12 AM

## 2013-01-31 MED ORDER — HYDROCHLOROTHIAZIDE 12.5 MG PO CAPS: 12.5000 mg | ORAL_CAPSULE | Freq: Every day | ORAL | Status: AC

## 2013-01-31 NOTE — Discharge Summary (Signed)
Obstetric Discharge Summary Reason for Admission: G3 P0 0 2 0 @ 40w 2d for IOL; mild gest HTN; dependent edema Prenatal Procedures: NST and ultrasound Intrapartum Procedures: spontaneous vaginal delivery, water birth Postpartum Procedures: none Complications-Operative and Postpartum: 1st degree perineal laceration and retained placenta, manually removed Hemoglobin  Date Value Range Status  01/30/2013 9.5* 12.0 - 15.0 g/dL Final     HCT  Date Value Range Status  01/30/2013 28.4* 36.0 - 46.0 % Final    Physical Exam:  General: alert, cooperative and no distress Lochia: appropriate Uterine Fundus: firm Incision: n/a DVT Evaluation: No evidence of DVT seen on physical exam. + 2 to +3 pedal, pretib edema with +2 edema to thighs and hips  Discharge Diagnoses: G3 P1 0 2 1 s/p SVD with 1st deg lac with repair.  Retained placenta with manual extraction.  Gest HTN, resolving Mild ABL anemia  Discharge Information: Date: 01/31/2013 Activity: pelvic rest Diet: routine Medications: PNV, Ibuprofen, Colace, Iron and HCTZ Condition: stable Instructions: refer to practice specific booklet Discharge to: home Follow-up Information   Follow up with Marlinda Mike, CNM. (BP check in the office next with CNM)    Specialty:  Obstetrics and Gynecology   Contact information:   [address] [place] [address] [phone]       Newborn Data: Live born female on 01/29/13 Birth Weight: 8 lb 3 oz (3714 g) APGAR: 8, 9  Home with mother.  Kaide Gage K 01/31/2013, 8:43 AM

## 2013-01-31 NOTE — Progress Notes (Signed)
Patient ID: Tonya Day, female   DOB: 08-Nov-1980, 32 y.o.   MRN: [id number] PPD # 2  Subjective: Pt reports feeling well and eager for d/c home/ Pain controlled with ibuprofen Tolerating po/ Voiding without problems/ No n/v Bleeding is light/ Newborn info:  Information for the patient's newborn:  Tonya, Day [[id number]]  female  / circ pending, to be performed by Dr Billy Coast this am/ Feeding: breast    Objective:  VS: Blood pressure 125/80, pulse 78, temperature 97.9 F (36.6 C), temperature source Oral, resp. rate 16  Recent Labs  01/29/13 0905 01/30/13 0605  WBC 9.1 13.3*  HGB 10.6* 9.5*  HCT 32.0* 28.4*  PLT 158 140*    Blood type: --/--/A NEG (09/16 0905) Rubella: Equivocal (07/01 0000)    Physical Exam:  General: A & O x 3  alert, cooperative and no distress CV: Regular rate and rhythm Resp: clear Abdomen: soft, nontender, normal bowel sounds Uterine Fundus: firm, below umbilicus, nontender Perineum: healing with good reapproximation Lochia: minimal Ext: edema +3 pedal, pretib and +2 to thighs and hips and Homans sign is negative, no sign of DVT    A/P: PPD # 2/ G3P1021/ S/P: SVD with 1st deg lac with repair Gest HTN; resolving.  Will cont HCTZ x 5 days BP check in office in 1 week Doing well and stable for discharge home RX: Ibuprofen 600mg  po Q 6 hrs prn pain #30 Refill x 1 Niferex 150mg  po QD/BID #30/#60 Refill x 1 Colace 100mg  po BID prn constipation HCTZ 12.5mg  po QD x 5 days WOB/GYN booklet given Routine pp visit in 6wks   Demetrius Revel, MSN, [facility] 01/31/2013, 8:41 AM

## 2013-01-31 NOTE — Progress Notes (Signed)
Pt discharged before CSW could assess history of anxiety. 

## 2014-03-03 IMAGING — US US OB COMP LESS 14 WK
1 series · 13 of 28 positions shown · non-contrast
Comparison: none

[Series 1: us ob comp less 14 wks · 13 of 40 slices shown]
[im 2/40]
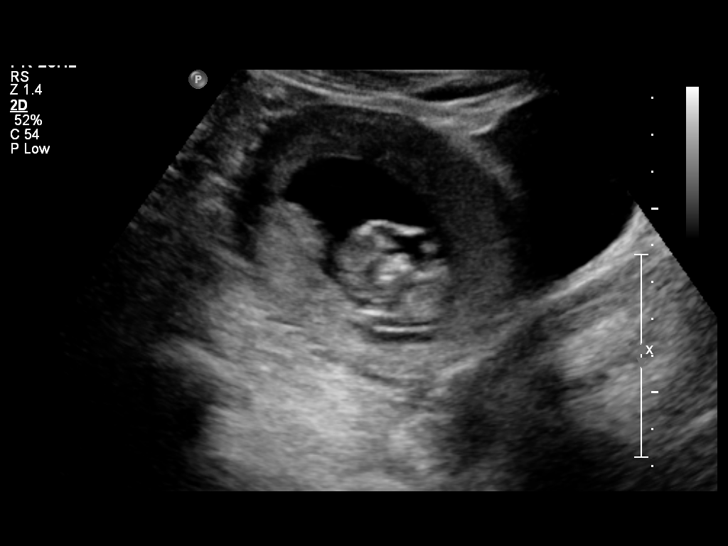
[im 5/40]
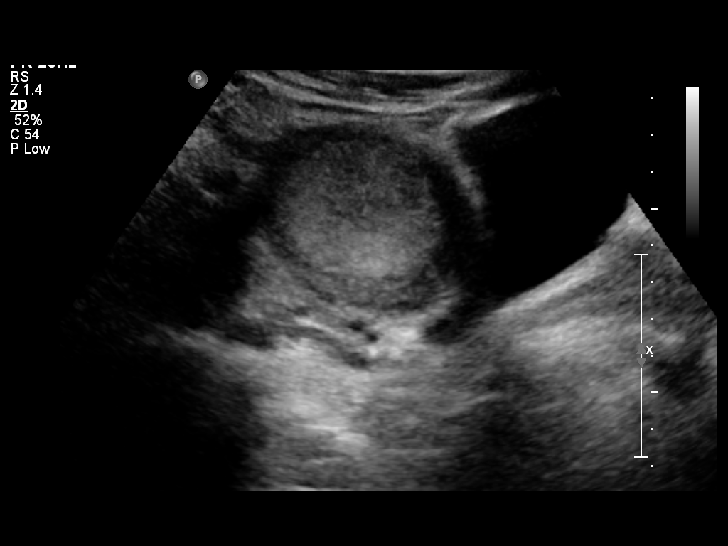
[im 8/40]
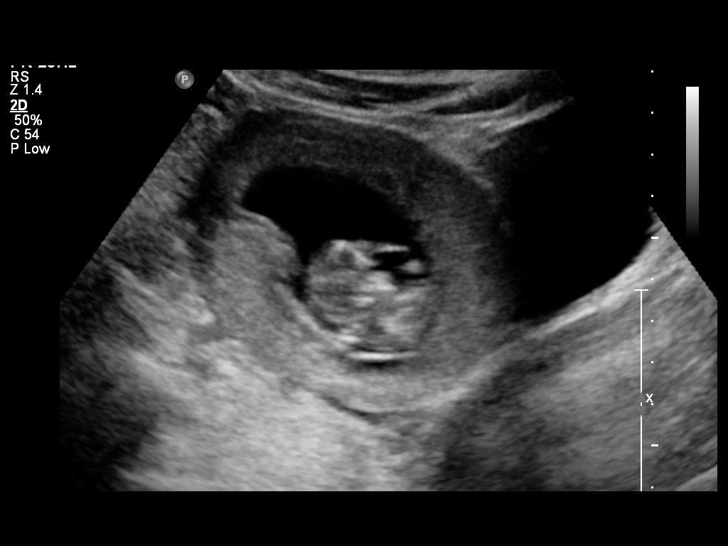
[im 11/40]
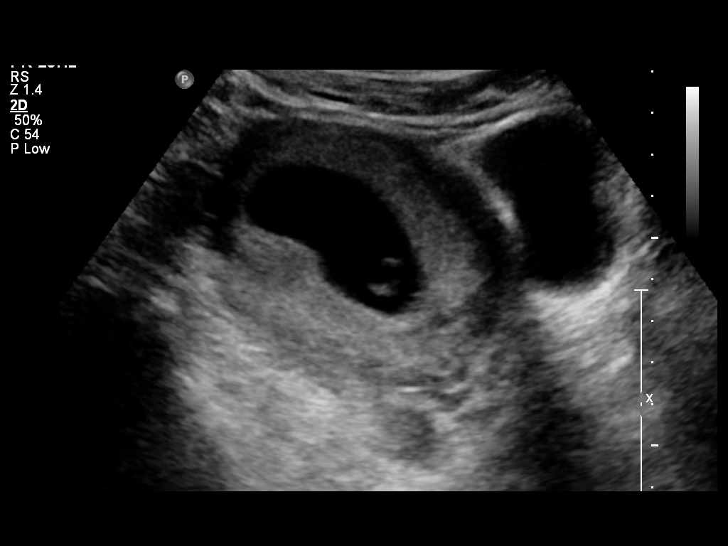
[im 14/40]
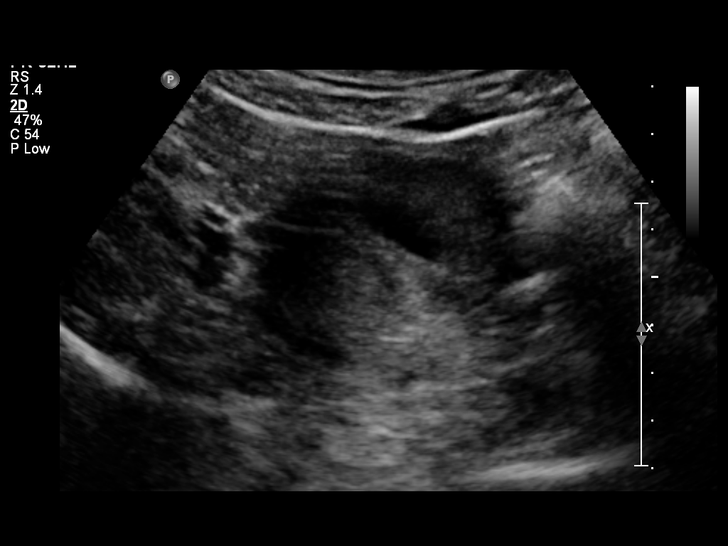
[im 16/40]
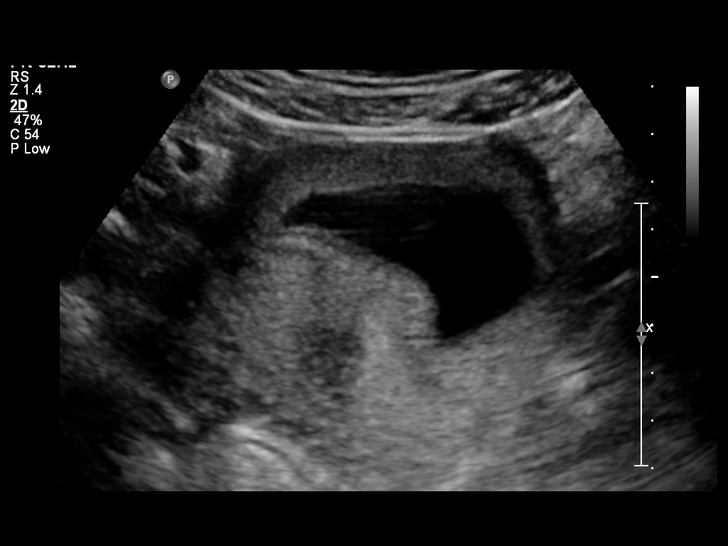
[im 21/40]
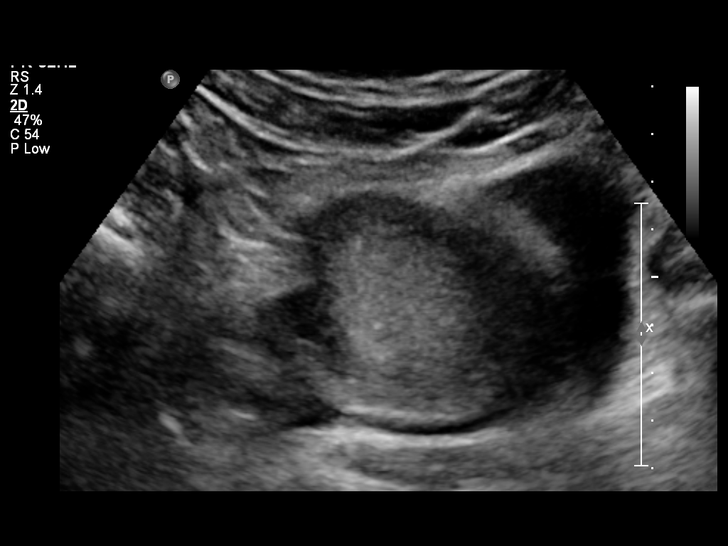
[im 24/40]
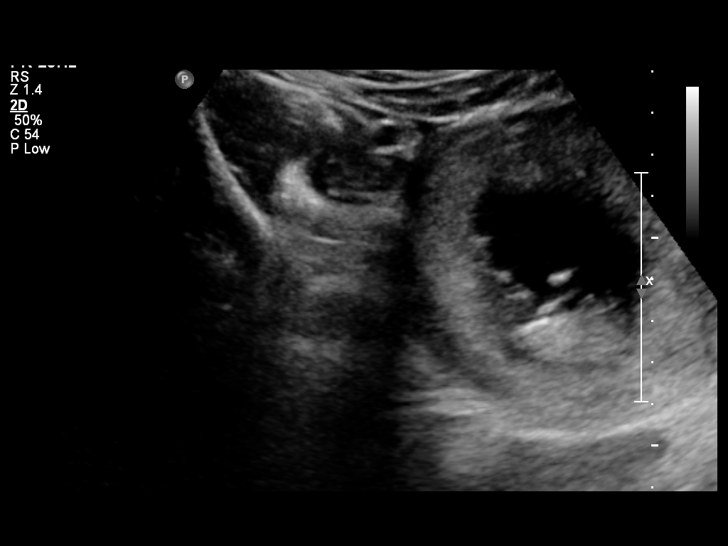
[im 27/40]
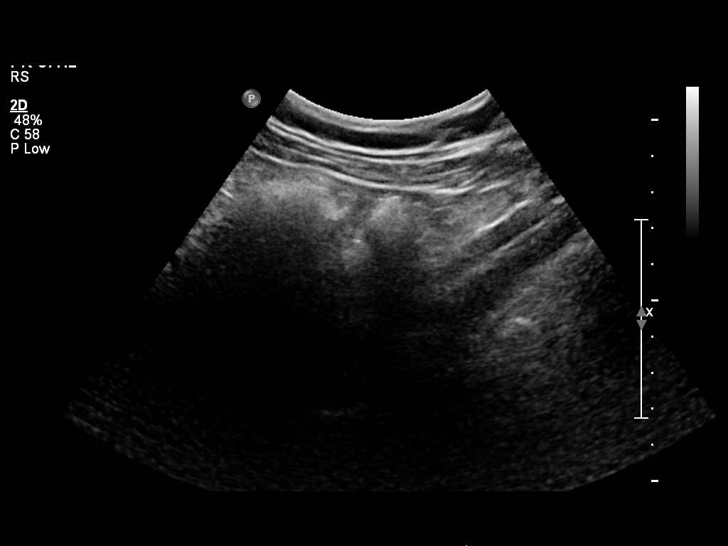
[im 29/40]
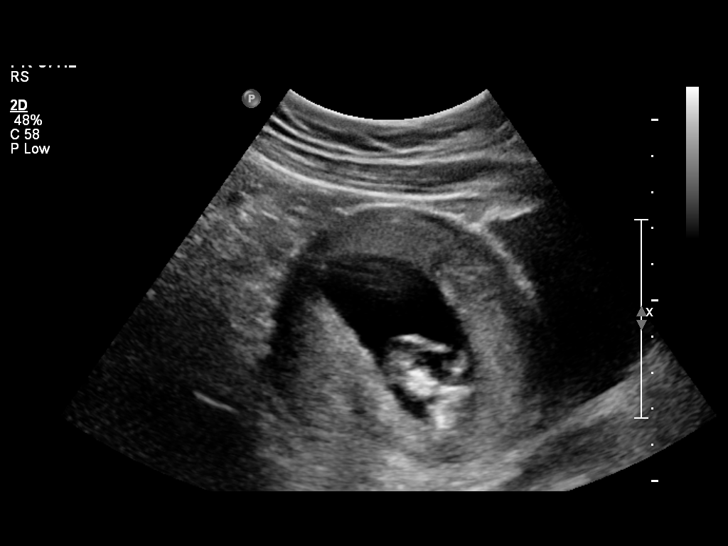
[im 32/40]
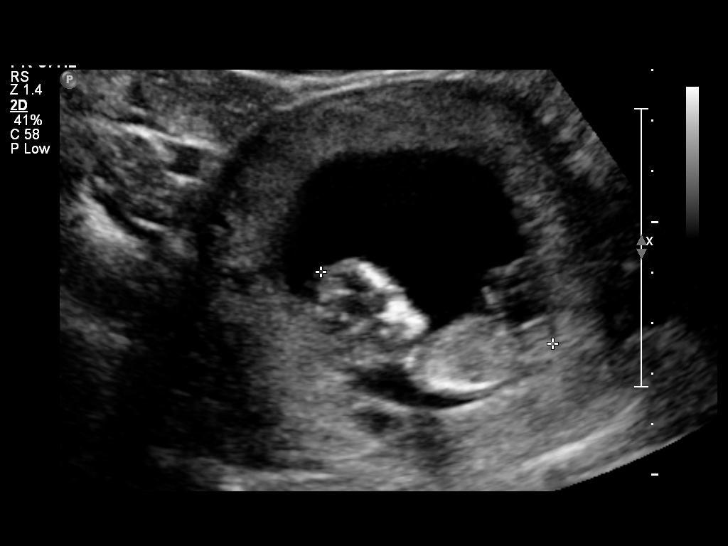
[im 35/40]
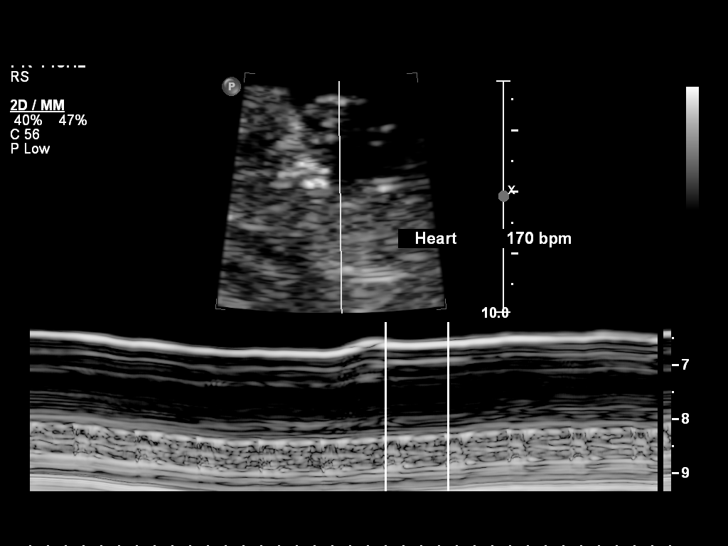
[im 38/40]
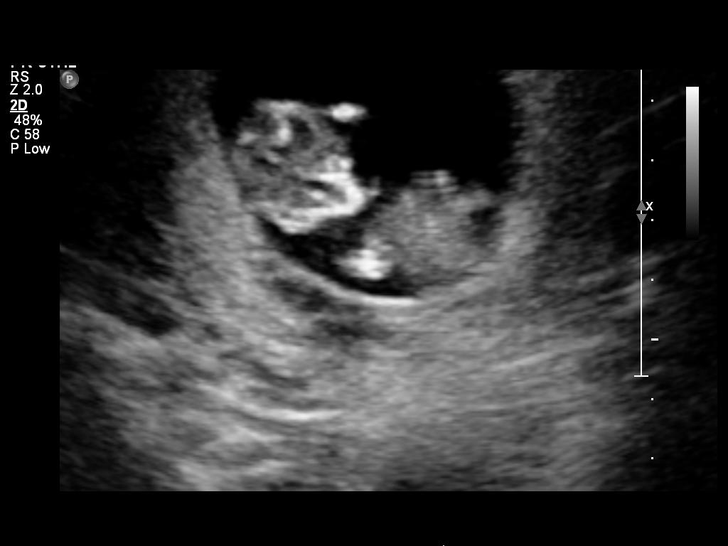

[13 of 28 positions shown; findings below may reference images not displayed]

OBSTETRICS REPORT
                      (Signed Final 07/13/2012 [DATE])

Service(s) Provided

 US OB COMP LESS 14 WKS                                76801.0
Indications

 Rh negative, for RhoGam
 Vaginal bleeding, unknown etiology
 Pelvic / Abdominal pain
Fetal Evaluation

 Num Of Fetuses:    1
 Preg. Location:    Intrauterine
 Gest. Sac:         Intrauterine, small
                    subchorionic bleed
 Yolk Sac:          Not visualized
 Fetal Pole:        Visualized
 Fetal Heart Rate:  170                         bpm
 Cardiac Activity:  Observed
Biometry

 CRL:     47.2  mm    G. Age:   11w 3d                 EDD:   01/29/13
Gestational Age

 LMP:           11w 5d       Date:   04/22/12                 EDD:   01/27/13
 Best:          11w 5d    Det. By:   LMP  (04/22/12)          EDD:   01/27/13
Cervix Uterus Adnexa

 Cervix:       Normal appearance by transabdominal scan.
 Uterus:       No abnormality visualized.
 Cul De Sac:   No free fluid seen.

 Left Ovary:   Not visualized.
 Right Ovary:  Not visualized.
 Adnexa:     No abnormality visualized.
Impression

 There is a single living intrauterine pregancy demonstrating
 an EGA by CRL of  11w 3d. This correlates well with
 expected EGA by LMP of 11w 5d  . No subchorionic
 hemorrhage is seen. Nonvisualized ovaries.

 questions or concerns.

## 2014-03-17 ENCOUNTER — Encounter (HOSPITAL_COMMUNITY): Payer: Self-pay

## 2016-06-27 IMAGING — CR DG FOOT COMPLETE 3+V*L*
3 series · 3 of 3 positions shown · non-contrast
Comparison: None.

CLINICAL DATA: Fall.

EXAM:
LEFT FOOT - COMPLETE 3+ VIEW

[foot ap]
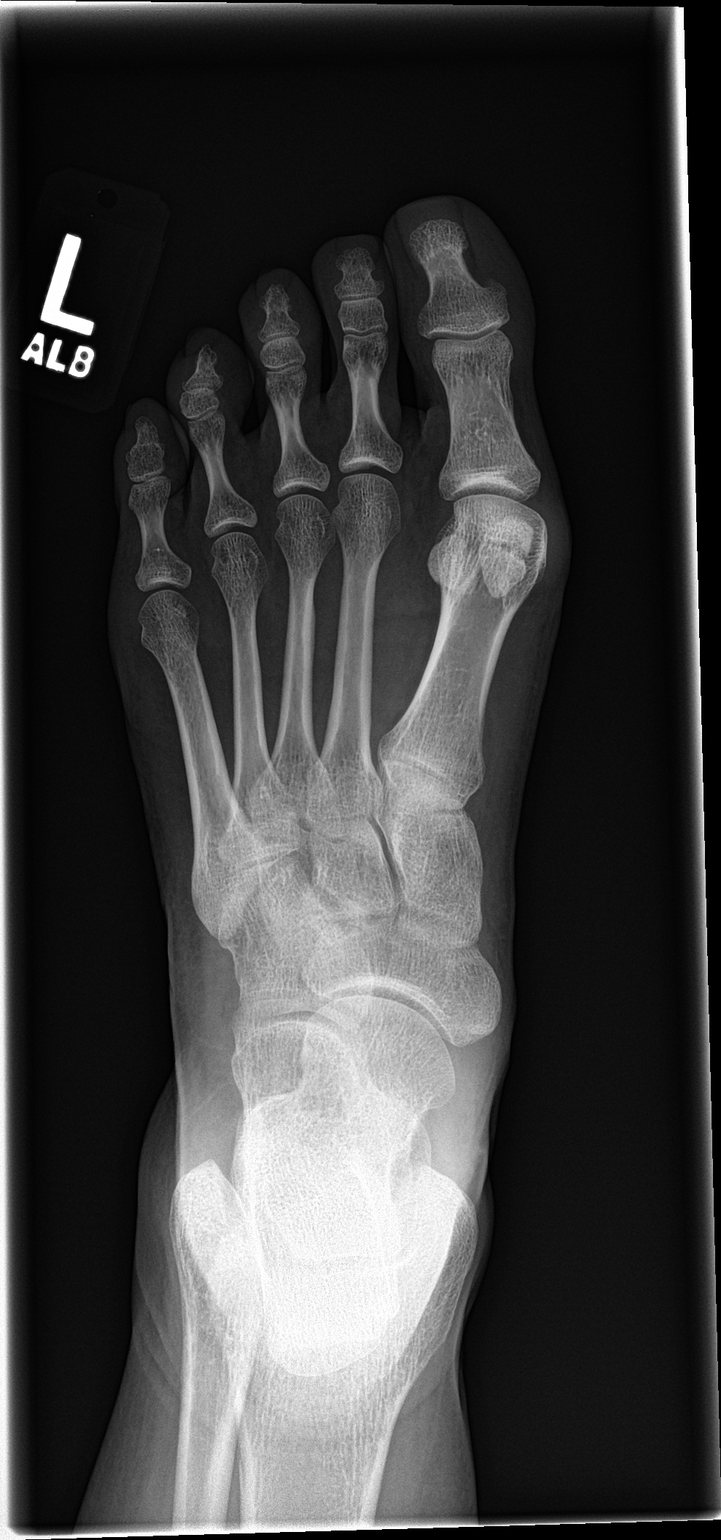

[foot obl]
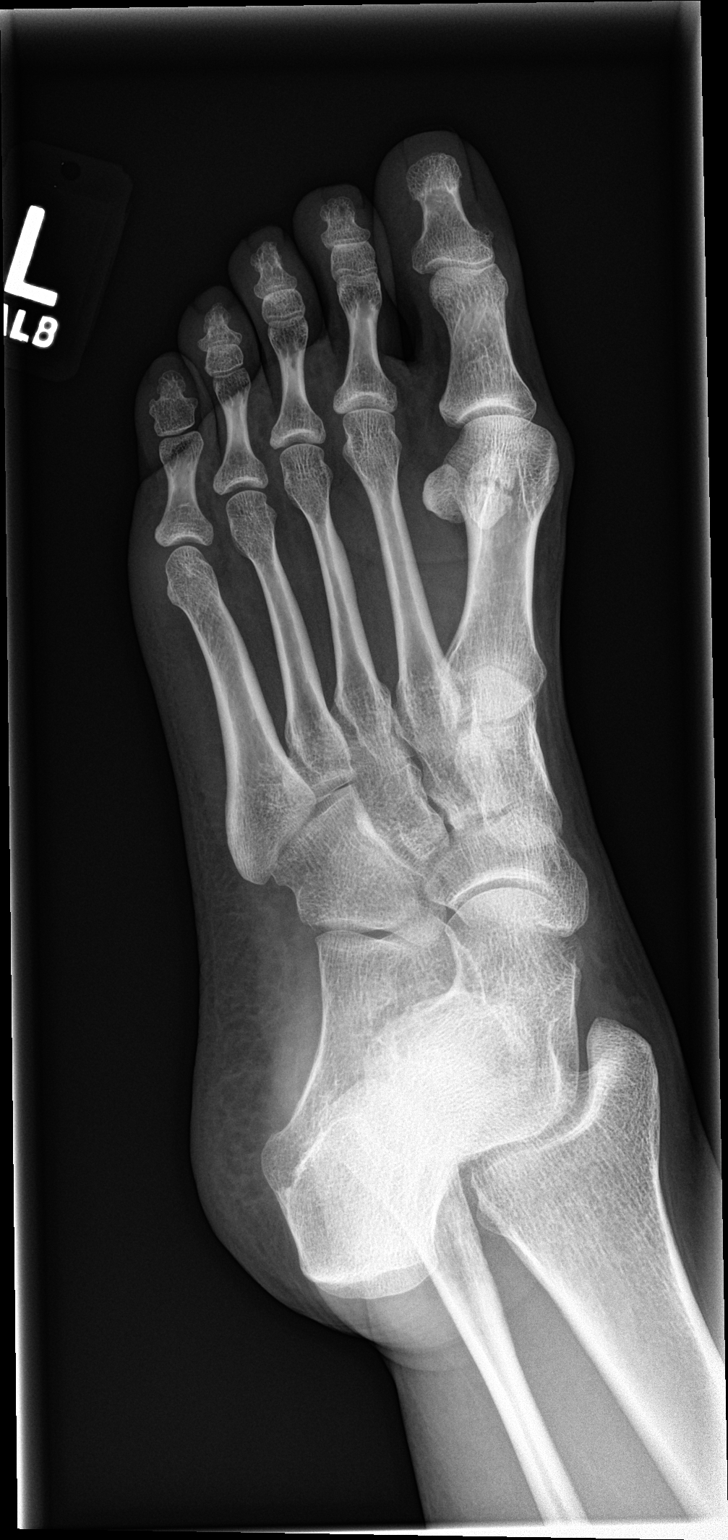

[foot lat]
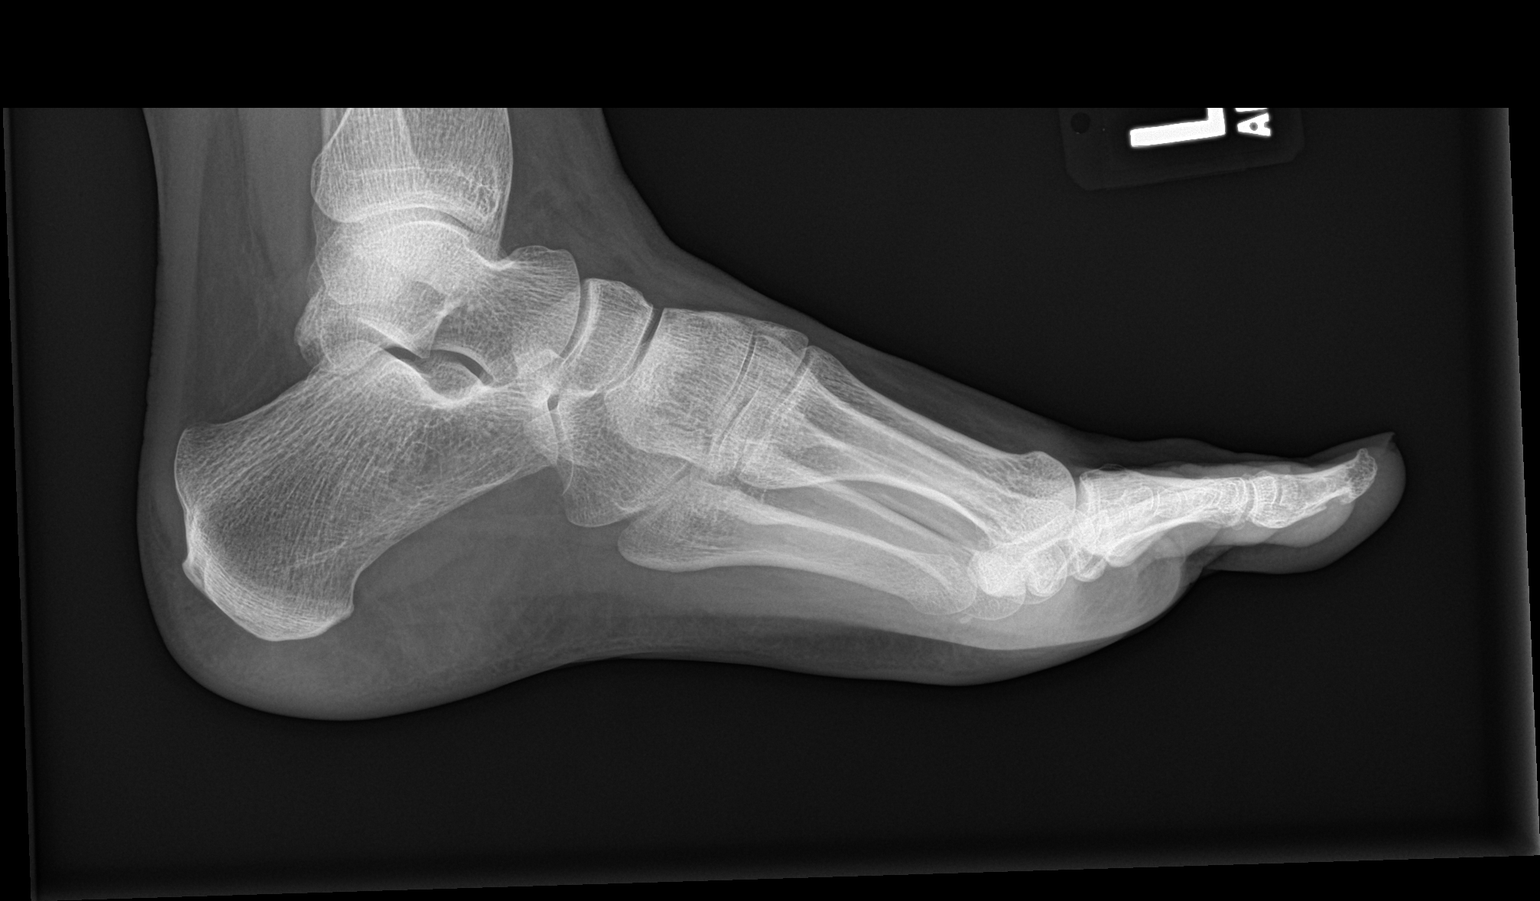

[3 of 3 positions shown; findings below may reference images not displayed]

FINDINGS: There is no evidence of fracture or dislocation. There is no
evidence of arthropathy or other focal bone abnormality. Soft
tissues are unremarkable.
IMPRESSION: Negative.

## 2018-06-21 ENCOUNTER — Emergency Department (HOSPITAL_COMMUNITY): Payer: BC Managed Care – PPO

## 2018-06-21 ENCOUNTER — Other Ambulatory Visit: Payer: Self-pay

## 2018-06-21 ENCOUNTER — Encounter (HOSPITAL_COMMUNITY): Payer: Self-pay

## 2018-06-21 ENCOUNTER — Emergency Department (HOSPITAL_COMMUNITY)
Admission: EM | Admit: 2018-06-21 | Discharge: 2018-06-21 | Disposition: A | Discharge status: Home / Self Care | Payer: BC Managed Care – PPO | Attending: Emergency Medicine | Admitting: Emergency Medicine

## 2018-06-21 DIAGNOSIS — Z79899 Other long term (current) drug therapy: Secondary | ICD-10-CM | POA: Diagnosis not present

## 2018-06-21 DIAGNOSIS — J45909 Unspecified asthma, uncomplicated: Secondary | ICD-10-CM | POA: Diagnosis not present

## 2018-06-21 DIAGNOSIS — R091 Pleurisy: Secondary | ICD-10-CM | POA: Insufficient documentation

## 2018-06-21 DIAGNOSIS — Z87891 Personal history of nicotine dependence: Secondary | ICD-10-CM | POA: Insufficient documentation

## 2018-06-21 DIAGNOSIS — Z91040 Latex allergy status: Secondary | ICD-10-CM | POA: Insufficient documentation

## 2018-06-21 DIAGNOSIS — R1011 Right upper quadrant pain: Secondary | ICD-10-CM | POA: Diagnosis present

## 2018-06-21 DIAGNOSIS — R079 Chest pain, unspecified: Secondary | ICD-10-CM

## 2018-06-21 LAB — COMPREHENSIVE METABOLIC PANEL
ALT: 16 U/L (ref 0–44)
AST: 19 U/L (ref 15–41)
Albumin: 3.9 g/dL (ref 3.5–5.0)
Alkaline Phosphatase: 89 U/L (ref 38–126)
Anion gap: 14 (ref 5–15)
BUN: 9 mg/dL (ref 6–20)
CO2: 23 mmol/L (ref 22–32)
Calcium: 9.1 mg/dL (ref 8.9–10.3)
Chloride: 104 mmol/L (ref 98–111)
Creatinine, Ser: 0.75 mg/dL (ref 0.44–1.00)
GFR calc Af Amer: >60 mL/min (ref >60)
GFR calc non Af Amer: >60 mL/min (ref >60)
Glucose, Bld: 99 mg/dL (ref 70–99)
Potassium: 4.1 mmol/L (ref 3.5–5.1)
Sodium: 141 mmol/L (ref 135–145)
Total Bilirubin: 0.6 mg/dL (ref 0.3–1.2)
Total Protein: 6.6 g/dL (ref 6.5–8.1)

## 2018-06-21 LAB — CBC WITH DIFFERENTIAL/PLATELET
Abs Immature Granulocytes: 0.03 10*3/uL (ref 0.00–0.07)
BASOS ABS: 0.1 10*3/uL (ref 0.0–0.1)
Basophils Relative: 1 %
Eosinophils Absolute: 0.6 10*3/uL — ABNORMAL HIGH (ref 0.0–0.5)
Eosinophils Relative: 6 %
HCT: 40.7 % (ref 36.0–46.0)
Hemoglobin: 12.8 g/dL (ref 12.0–15.0)
Immature Granulocytes: 0 %
Lymphocytes Relative: 19 %
Lymphs Abs: 1.9 10*3/uL (ref 0.7–4.0)
MCH: 28.1 pg (ref 26.0–34.0)
MCHC: 31.4 g/dL (ref 30.0–36.0)
MCV: 89.5 fL (ref 80.0–100.0)
Monocytes Absolute: 0.6 10*3/uL (ref 0.1–1.0)
Monocytes Relative: 6 %
NRBC: 0 % (ref 0.0–0.2)
Neutro Abs: 6.8 10*3/uL (ref 1.7–7.7)
Neutrophils Relative %: 68 %
Platelets: 275 10*3/uL (ref 150–400)
RBC: 4.55 MIL/uL (ref 3.87–5.11)
RDW: 12 % (ref 11.5–15.5)
WBC: 9.9 10*3/uL (ref 4.0–10.5)

## 2018-06-21 LAB — URINALYSIS, ROUTINE W REFLEX MICROSCOPIC
Bilirubin Urine: NEGATIVE
Glucose, UA: NEGATIVE mg/dL
Hgb urine dipstick: NEGATIVE
Ketones, ur: NEGATIVE mg/dL
Leukocytes, UA: NEGATIVE
NITRITE: NEGATIVE
Protein, ur: NEGATIVE mg/dL
Specific Gravity, Urine: 1.005 (ref 1.005–1.030)
pH: 6 (ref 5.0–8.0)

## 2018-06-21 LAB — I-STAT BETA HCG BLOOD, ED (MC, WL, AP ONLY): I-stat hCG, quantitative: <5 m[IU]/mL (ref <5)

## 2018-06-21 LAB — LIPASE, BLOOD: Lipase: 24 U/L (ref 11–51)

## 2018-06-21 LAB — I-STAT TROPONIN, ED: Troponin i, poc: 0 ng/mL (ref 0.00–0.08)

## 2018-06-21 MED ORDER — SODIUM CHLORIDE 0.9 % IV SOLN: INTRAVENOUS | Status: DC

## 2018-06-21 MED ORDER — SODIUM CHLORIDE 0.9 % IV BOLUS: 1000.0000 mL | Freq: Once | INTRAVENOUS | Status: DC

## 2018-06-21 NOTE — ED Triage Notes (Signed)
Pt c/o chest pain for two days, progressed on the Rt side only. Yesterday morning 800 mg ibuprophen did help the pain. 2 EKG's done at [facility] was normal & US done yesterday to rule out gall bladder (per pt).

## 2018-06-21 NOTE — ED Notes (Signed)
Patient transported to X-ray 

## 2018-06-21 NOTE — ED Notes (Signed)
Pt verbalized understanding of discharge paperwork and follow-up care.  °

## 2018-06-21 NOTE — ED Provider Notes (Signed)
[facility] EMERGENCY DEPARTMENT Provider Note   CSN: [id number] Arrival date & time: 06/21/18  1053     History   Chief Complaint Right-sided chest pain  HPI Tonya Day is a 38 y.o. female.  HPI Patient presented to the emergency room for evaluation of right-sided chest pain.  Patient states she started having pain on the right side of her chest that started about 2 days ago.  The pain is sharp and goes to her right shoulder blade.  She has been taking ibuprofen around-the-clock to help alleviate the pain.  Currently she is not having any pain but the pain will return after the ibuprofen wears off.  Patient denies any trouble with nausea or vomiting.  She has not noticed any change in the pain with eating although she has not had a great appetite for the last day.  She has not had any vomiting.  No diarrhea or dysuria.  [facility].  She had 2 EKGs.  She was told to come to the emergency room to evaluate for possible gallbladder problems and to get an [place] Past Medical History:  Diagnosis Date  . Abnormal pap   . Abnormal Pap smear 2009   leep; LAST PAP 2013  . Anemia    TAKES FE  . 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 Asthma    NO PCP  . Bladder infection   . Cervical dysplasia   . Complication of anesthesia   . History of chicken pox   . HPV (human papilloma virus) infection   . Infection    UTI X 2  . Kidney infection 2000  . Ovarian cyst   . PONV (postoperative nausea and vomiting)   . Shortness of breath     Patient Active Problem List   Diagnosis Date Noted  . Postpartum care following vaginal delivery (9/16) 01/29/2013  . Retained placenta or amniotic membrane after delivery without hemorrhage 01/29/2013  . Hx Pyelonephritis 06/30/2012  . Anxiety state, unspecified 06/30/2012  . Latex allergy 06/30/2012  . CIN III (cervical intraepithelial neoplasia grade III) with severe dysplasia 12/16/2011     Past Surgical History:  Procedure Laterality Date  . ABDOMINAL SURGERY    . WISDOM TOOTH EXTRACTION       OB History    Gravida  3   Para  1   Term  1   Preterm      AB  2   Living  1     SAB  1   TAB      Ectopic      Multiple      Live Births  1            Home Medications    Prior to Admission medications   Medication Sig Start Date End Date Taking? Authorizing Provider  budesonide (PULMICORT) 180 MCG/ACT inhaler Inhale 1 puff into the lungs as needed (shortness of breath).    [provider]  hydrochlorothiazide (MICROZIDE) 12.5 MG capsule Take 1 capsule (12.5 mg total) by mouth daily. 01/31/13   Arlana Lindau, NP  Magnesium 500 MG CAPS Take 1,000 mg by mouth at bedtime.    [provider]  OVER THE COUNTER MEDICATION Take 6 tablets by mouth daily. Patient takes Arbonne Vitamin    [provider]  PRESCRIPTION MEDICATION Apply 1 application topically as needed (for itching). Compounded cream 1 part mupirocin, 1  part betamethasone and 2% miconazole powder    [provider]    Family History Family History  Problem Relation Age of Onset  . Depression Mother   . Hypertension Mother   . Hearing loss Mother   . Asthma Father   . Drug abuse Father   . Alcohol abuse Father   . Depression Sister   . Cancer Maternal Uncle        KIDNEY  . Diabetes Maternal Uncle   . Alcohol abuse Maternal Uncle   . Heart disease Maternal Uncle   . Hyperlipidemia Maternal Uncle   . Kidney disease Maternal Uncle   . Cancer Paternal Aunt        UNKNOWN; FEMALE?  Marland Kitchen Arthritis Maternal Grandmother   . Hyperlipidemia Maternal Grandmother   . Stroke Maternal Grandmother   . Heart disease Maternal Grandfather     Social History Social History   Tobacco Use  . Smoking status: Former Smoker    Types: Cigarettes    Last attempt to quit: 07/21/2010    Years since quitting: 7.9  . Smokeless tobacco: Never Used  Substance Use Topics   . Alcohol use: No    Alcohol/week: 3.0 standard drinks    Types: 3 drink(s) per week    Comment: LAST DRANK BEFORE PREG  . Drug use: No     Allergies   Latex; Albuterol; Doxycycline; Other; and Red dye   Review of Systems Review of Systems  All other systems reviewed and are negative.    Physical Exam Updated Vital Signs BP 134/78   Pulse 96   Temp 98.7 F (37.1 C) (Oral)   Ht 1.575 m (5\' 2" )   Wt 89.8 kg   Breastfeeding Yes   BMI 36.21 kg/m   Physical Exam Vitals signs and nursing note reviewed.  Constitutional:      General: She is not in acute distress.    Appearance: She is well-developed.  HENT:     Head: Normocephalic and atraumatic.     Right Ear: External ear normal.     Left Ear: External ear normal.  Eyes:     General: No scleral icterus.       Right eye: No discharge.        Left eye: No discharge.     Conjunctiva/sclera: Conjunctivae normal.  Neck:     Musculoskeletal: Neck supple.     Trachea: No tracheal deviation.  Cardiovascular:     Rate and Rhythm: Normal rate and regular rhythm.  Pulmonary:     Effort: Pulmonary effort is normal. No respiratory distress.     Breath sounds: Normal breath sounds. No stridor. No wheezing or rales.  Abdominal:     General: Bowel sounds are normal. There is no distension.     Palpations: Abdomen is soft.     Tenderness: There is no abdominal tenderness. There is no guarding or rebound.  Musculoskeletal:        General: No swelling or tenderness.     Right lower leg: No edema.     Left lower leg: No edema.  Skin:    General: Skin is warm and dry.     Findings: No rash.  Neurological:     Mental Status: She is alert.     Cranial Nerves: No cranial nerve deficit (no facial droop, extraocular movements intact, no slurred speech).     Sensory: No sensory deficit.     Motor: No abnormal muscle tone or seizure activity.  Coordination: Coordination normal.      ED Treatments / Results  Labs (all  labs ordered are listed, but only abnormal results are displayed) Labs Reviewed  CBC WITH DIFFERENTIAL/PLATELET - Abnormal; Notable for the following components:      Result Value   Eosinophils Absolute 0.6 (*)    All other components within normal limits  URINALYSIS, ROUTINE W REFLEX MICROSCOPIC - Abnormal; Notable for the following components:   APPearance HAZY (*)    All other components within normal limits  COMPREHENSIVE METABOLIC PANEL  LIPASE, BLOOD  I-STAT BETA HCG BLOOD, ED ([facility], [facility], [facility] ONLY)  I-STAT TROPONIN, ED    EKG EKG Interpretation  Date/Time:  Thursday June 21 2018 11:19:08 EST Ventricular Rate:  96 PR Interval:  124 QRS Duration: 82 QT Interval:  372 QTC Calculation: 469 R Axis:   76 Text Interpretation:  Normal sinus rhythm Normal ECG No old tracing to compare Confirmed by Linwood Dibbles [phone]) on 06/21/2018 11:29:31 AM   Radiology Dg Chest 2 View  Result Date: 06/21/2018 CLINICAL DATA:  Chest pain. EXAM: CHEST - 2 VIEW COMPARISON:  None. FINDINGS: The heart size and mediastinal contours are within normal limits. Both lungs are clear. No pneumothorax or pleural effusion is noted. The visualized skeletal structures are unremarkable. IMPRESSION: No active cardiopulmonary disease. Electronically Signed   By: Lupita Raider, M.D.   On: 06/21/2018 12:18   US Abdomen Limited Ruq  Result Date: 06/21/2018 CLINICAL DATA:  Chest pain x2 days EXAM: ULTRASOUND ABDOMEN LIMITED RIGHT UPPER QUADRANT COMPARISON:  None. FINDINGS: Gallbladder: No gallstones or wall thickening visualized. No sonographic Murphy sign noted by sonographer. Common bile duct: Diameter: 3.5 mm, unremarkable Liver: No focal lesion identified. Within normal limits in parenchymal echogenicity. Portal vein is patent on color Doppler imaging with normal direction of blood flow towards the liver. IMPRESSION: Negative Electronically Signed   By: Corlis Leak M.D.   On: 06/21/2018 11:44    Procedures Procedures  (including critical care time)  Medications Ordered in ED Medications  sodium chloride 0.9 % bolus 1,000 mL (1,000 mLs Intravenous Not Given 06/21/18 1209)    And  0.9 %  sodium chloride infusion ( Intravenous Not Given 06/21/18 1209)     Initial Impression / Assessment and Plan / ED Course  I have reviewed the triage vital signs and the nursing notes.  Pertinent labs & imaging results that were available during my care of the patient were reviewed by me and considered in my medical decision making (see chart for details).  Clinical Course as of Jun 21 1256  Thu Jun 21, 2018  1116 [facility] negative   [JK]  1258 Lab tests and imaging tests are negative   [JK]    Clinical Course User Index [JK] Linwood Dibbles, MD    Patient presented to the emergency room for evaluation of right-sided chest pain.  Symptoms are not suggestive of cardiac etiology.  Her EKG is normal.  Patient does not have any risk factors for PE.  She is PERC negative.  Patient was sent from an outside facility to have an evaluation of her gallbladder.  Patient is not having abdominal tenderness right now on her ultrasound is negative.  Suspect her symptoms may be related to pleurisy.  Musculoskeletal etiology is also a possibility.  At this time there does not appear to be any evidence of an acute emergency medical condition and the patient appears stable for discharge with appropriate outpatient follow up.  Final Clinical Impressions(s) / ED Diagnoses   Final diagnoses:  Chest pain  Pleurisy    ED Discharge Orders    None       Linwood DibblesKnapp, Zuriah Bordas, MD 06/21/18 1258

## 2018-06-21 NOTE — Discharge Instructions (Addendum)
Continue taking ibuprofen as needed for pain, follow-up with your doctor next week to be rechecked, return to the emergency room for worsening symptoms shortness of breath or other concerns

## 2020-02-09 IMAGING — US US ABDOMEN LIMITED
1 series · 14 of 25 positions shown · non-contrast
Comparison: None.

CLINICAL DATA: Chest pain x2 days

EXAM:
ULTRASOUND ABDOMEN LIMITED RIGHT UPPER QUADRANT

[Series 1: us abdomen limited · 14 of 37 slices shown]
[im 1/37]
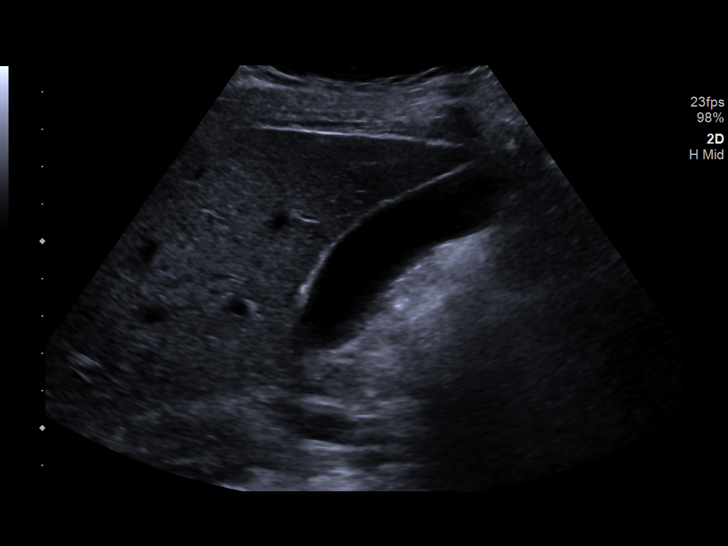
[im 4/37]
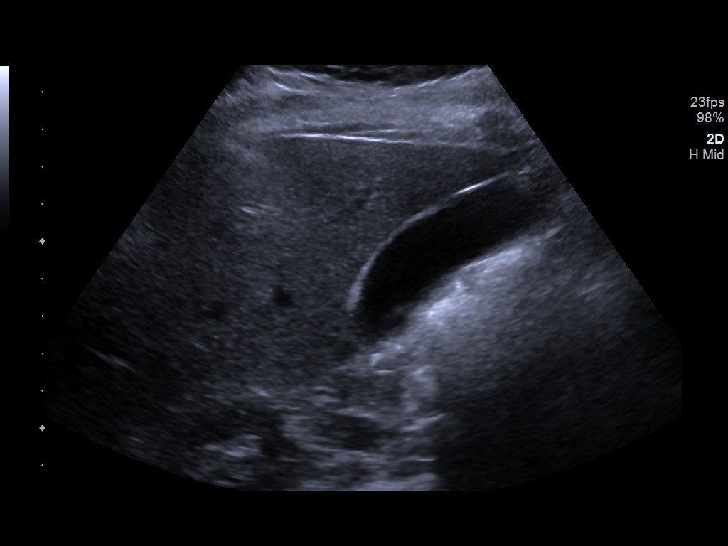
[im 7/37]
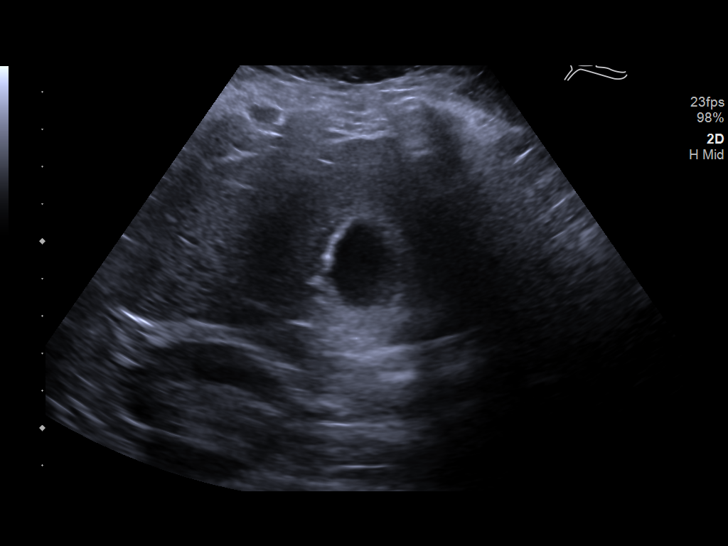
[im 10/37]
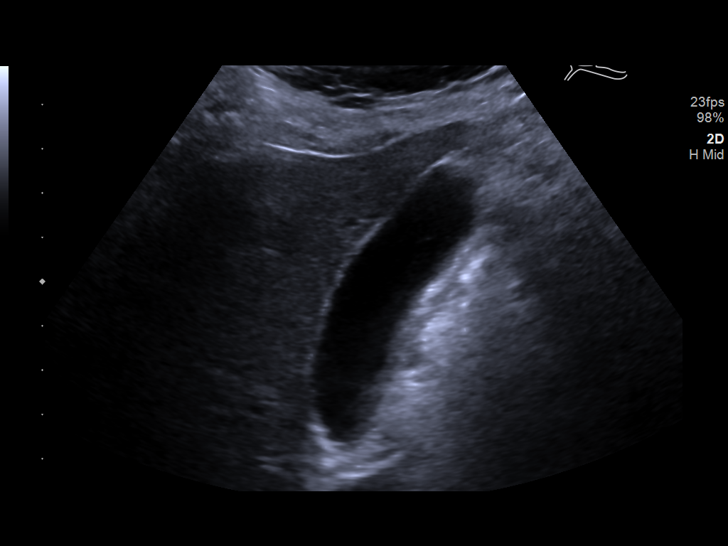
[im 13/37]
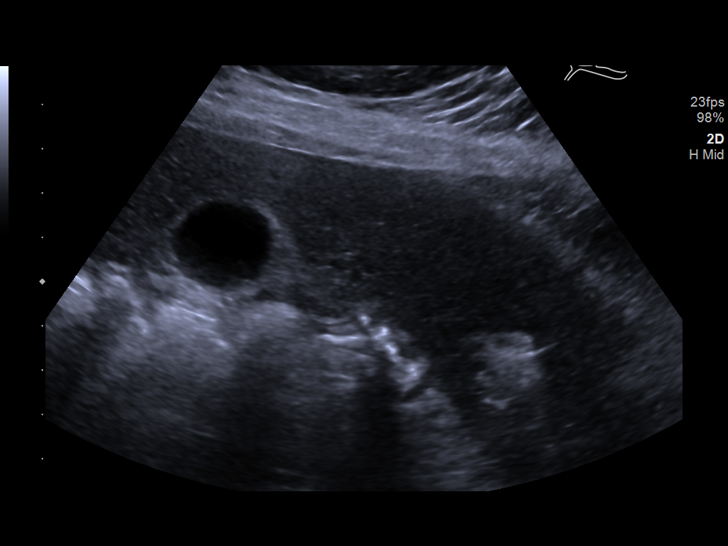
[im 14/37]
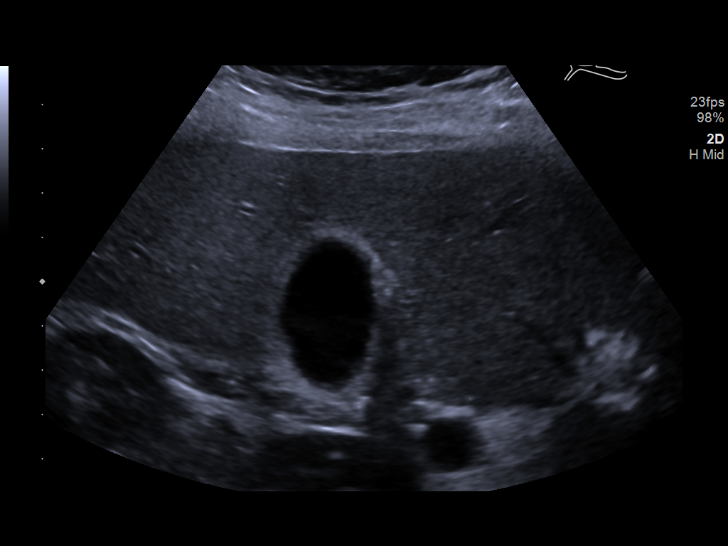
[im 17/37]
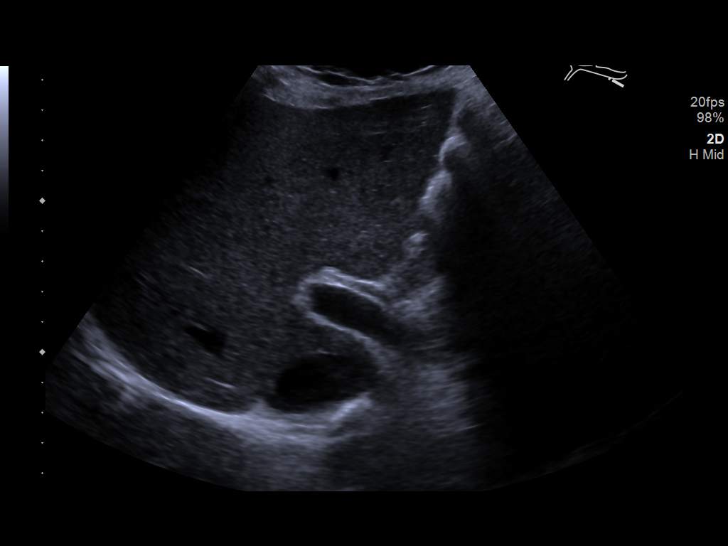
[im 20/37]
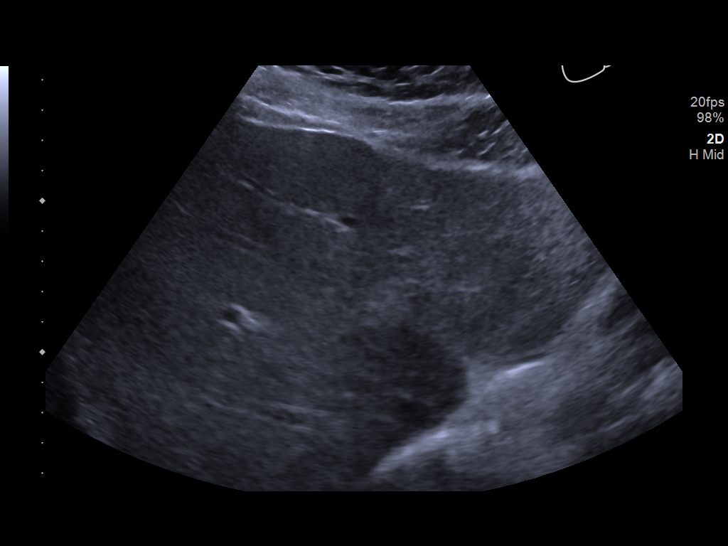
[im 23/37]
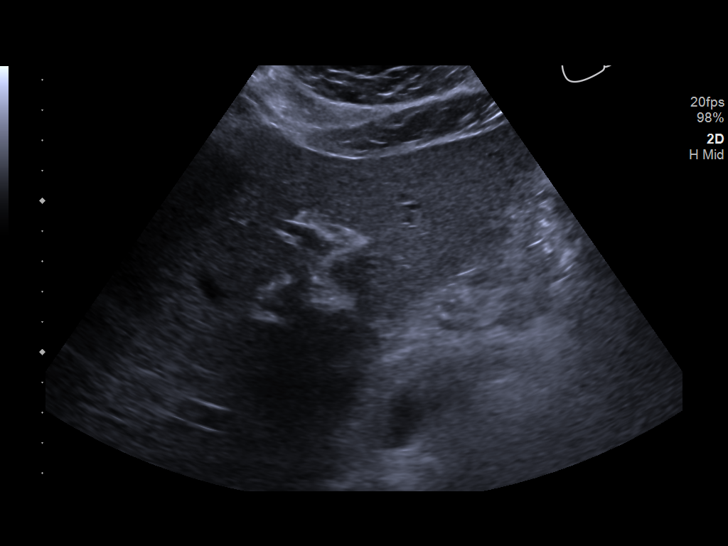
[im 25/37]
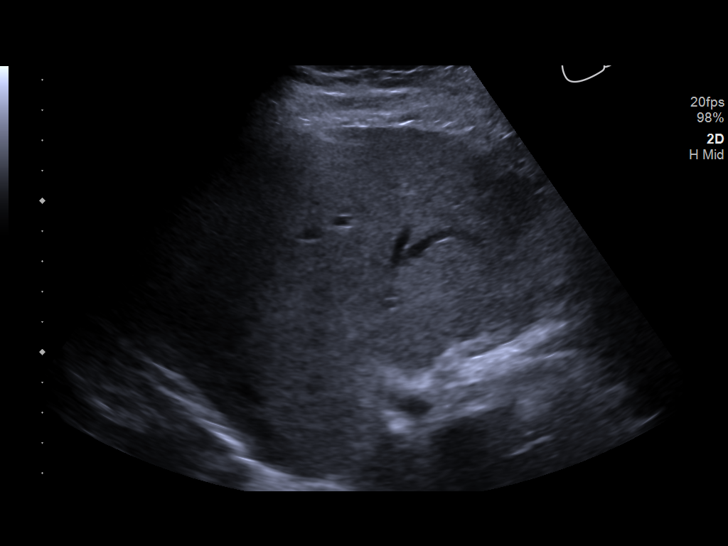
[im 28/37]
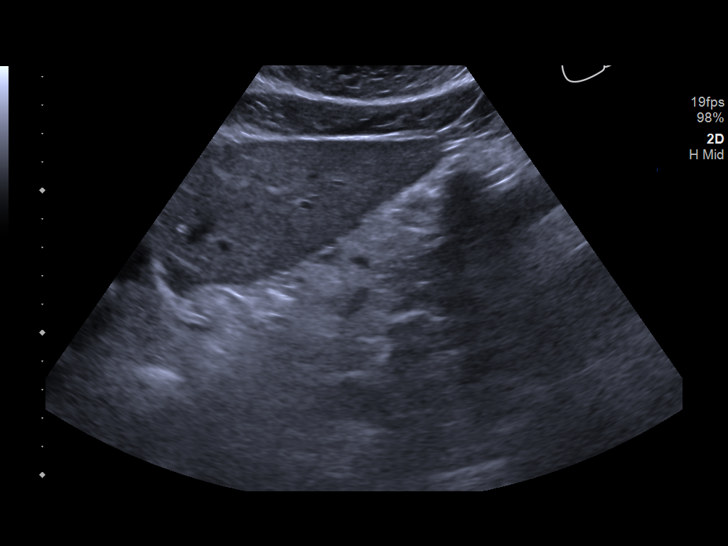
[im 31/37]
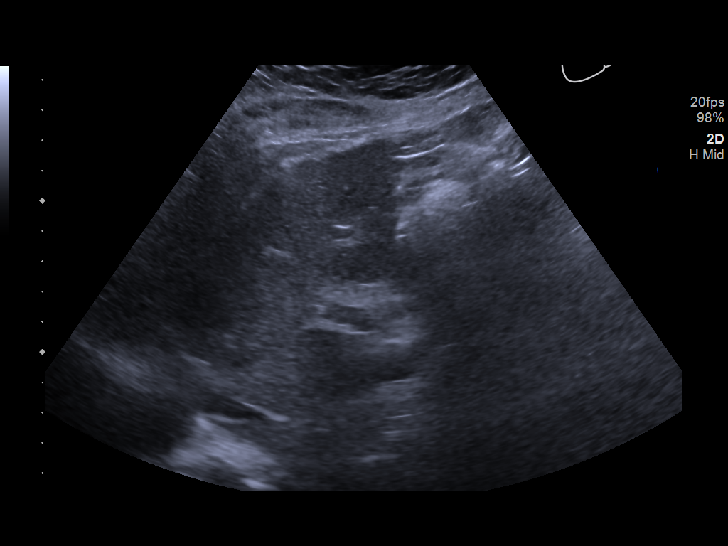
[im 34/37]
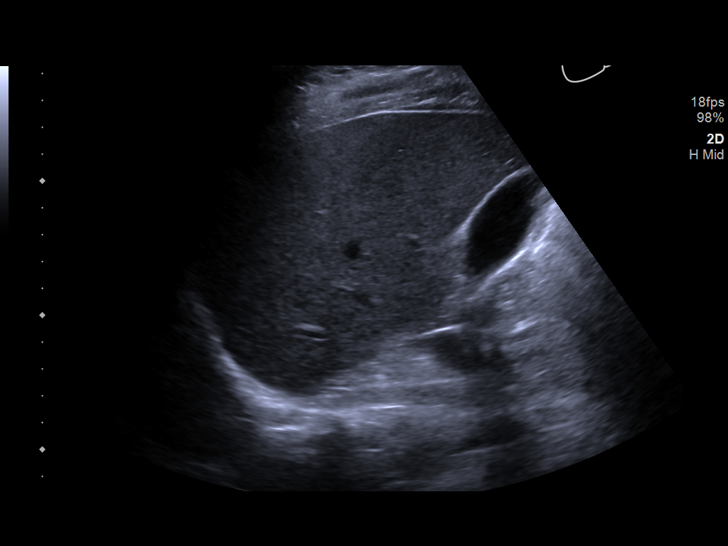
[im 37/37]
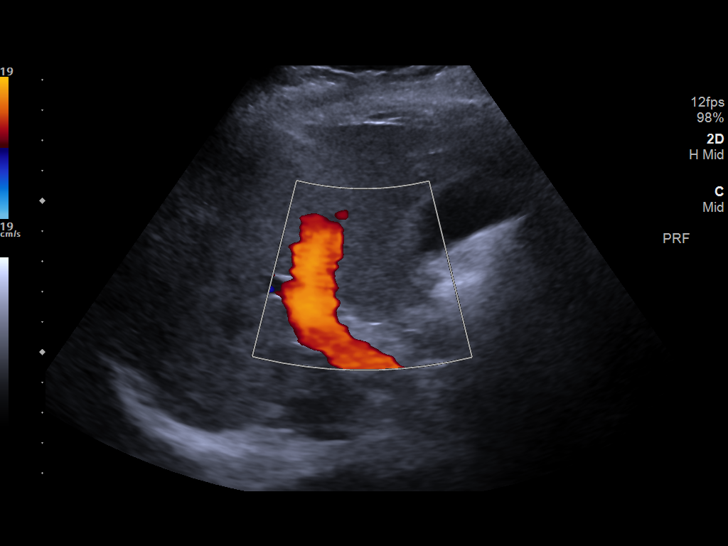

[14 of 25 positions shown; findings below may reference images not displayed]

FINDINGS: Gallbladder:

No gallstones or wall thickening visualized. No sonographic Murphy
sign noted by sonographer.

Common bile duct:

Diameter: 3.5 mm, unremarkable

Liver:

No focal lesion identified. Within normal limits in parenchymal
echogenicity. Portal vein is patent on color Doppler imaging with
normal direction of blood flow towards the liver.
IMPRESSION: Negative

## 2024-04-30 ENCOUNTER — Other Ambulatory Visit: Payer: Self-pay

## 2024-04-30 DIAGNOSIS — R319 Hematuria, unspecified: Secondary | ICD-10-CM

## 2024-05-21 ENCOUNTER — Ambulatory Visit
Admission: RE | Admit: 2024-05-21 | Discharge: 2024-05-21 | Disposition: A | Discharge status: Home / Self Care | Source: Ambulatory Visit

## 2024-05-21 DIAGNOSIS — R319 Hematuria, unspecified: Secondary | ICD-10-CM
